# Patient Record
Sex: Female | Born: 1985 | Race: White | Hispanic: No | Marital: Married | Smoking: Never smoker
Health system: Southern US, Community
[De-identification: ages and names within clinical notes are randomized; demographics above are authoritative.]

## PROBLEM LIST (undated history)

## (undated) DIAGNOSIS — K219 Gastro-esophageal reflux disease without esophagitis: Secondary | ICD-10-CM

## (undated) HISTORY — DX: Gastro-esophageal reflux disease without esophagitis: K21.9

## (undated) HISTORY — PX: NO PAST SURGERIES: SHX2092

---
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
## 2007-03-18 ENCOUNTER — Emergency Department (HOSPITAL_COMMUNITY): Admission: EM | Admit: 2007-03-18 | Discharge: 2007-03-18 | Payer: Self-pay | Admitting: Emergency Medicine

## 2007-09-02 ENCOUNTER — Emergency Department (HOSPITAL_COMMUNITY): Admission: EM | Admit: 2007-09-02 | Discharge: 2007-09-02 | Payer: Self-pay | Admitting: Family Medicine

## 2009-01-08 IMAGING — CR DG CHEST 2V
2 series · 2 of 2 positions shown · non-contrast
Comparison: There are no prior studies for comparison.

CLINICAL DATA: Cough. 
 CHEST - 2 VIEW:

[view not recorded (1 of 2)]
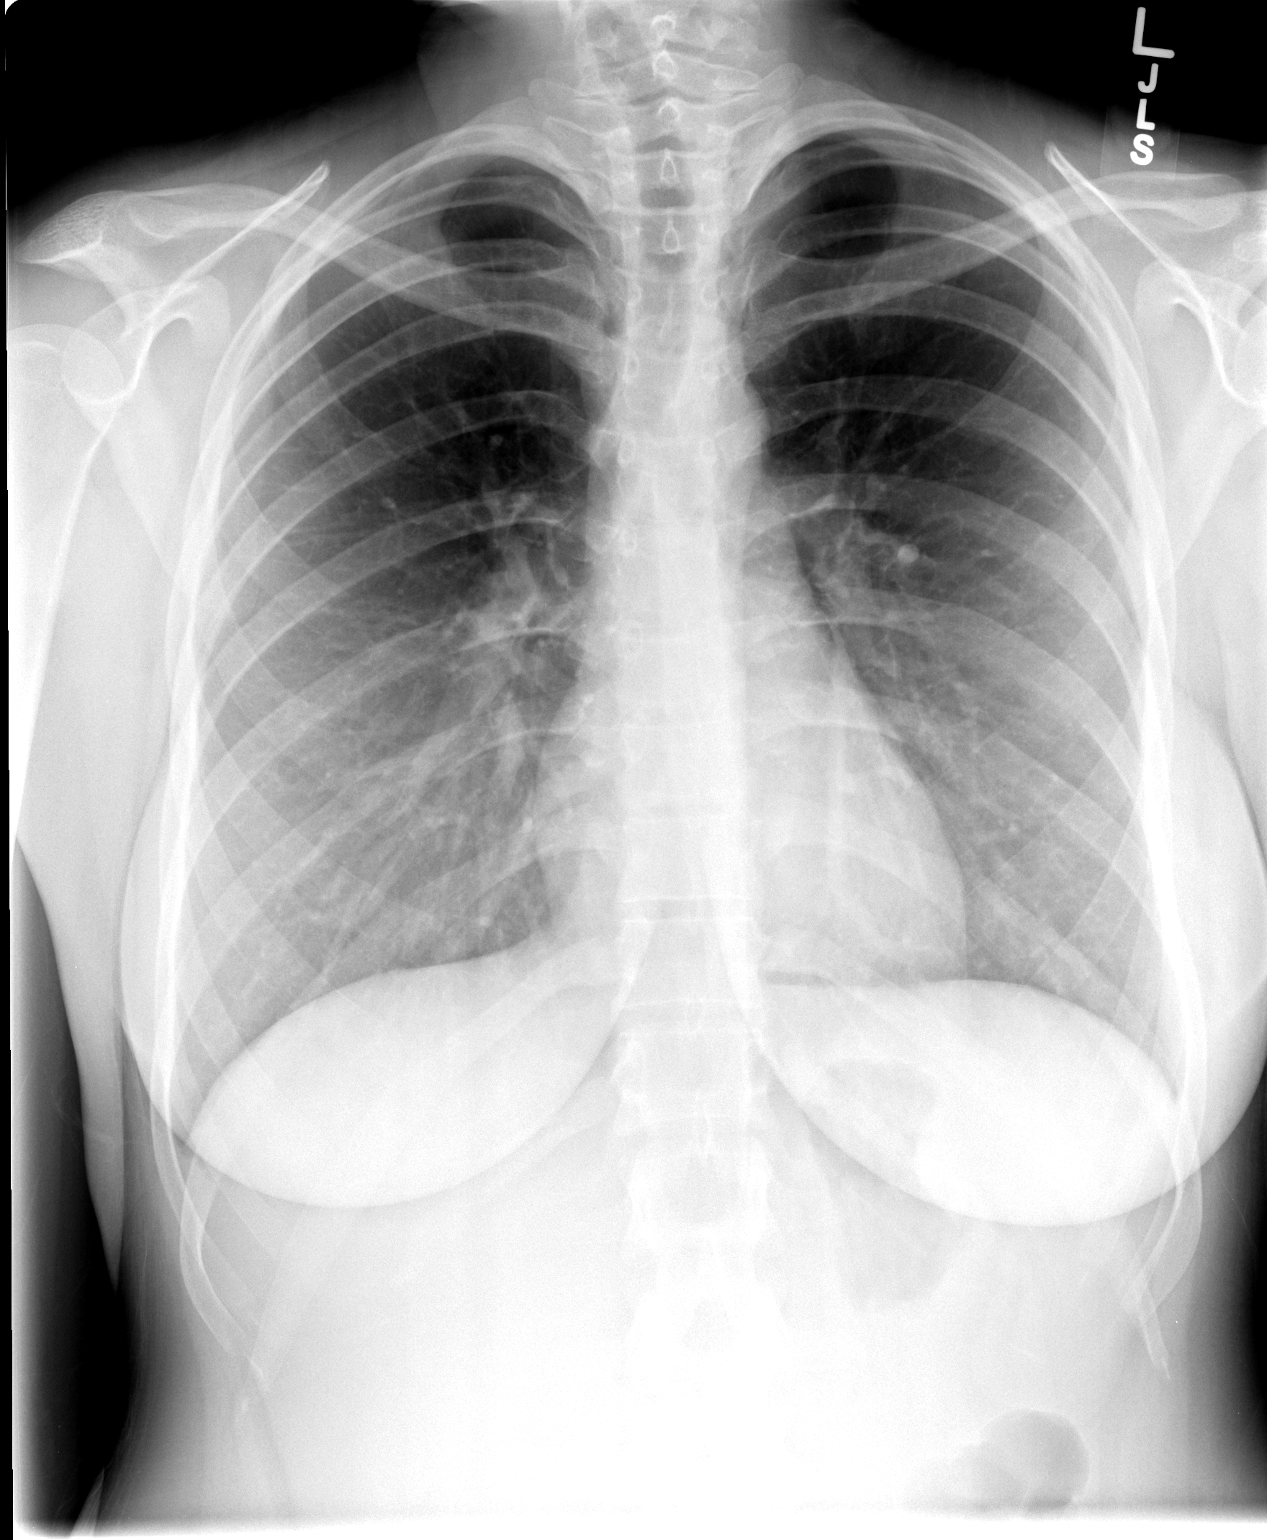

[view not recorded (2 of 2)]
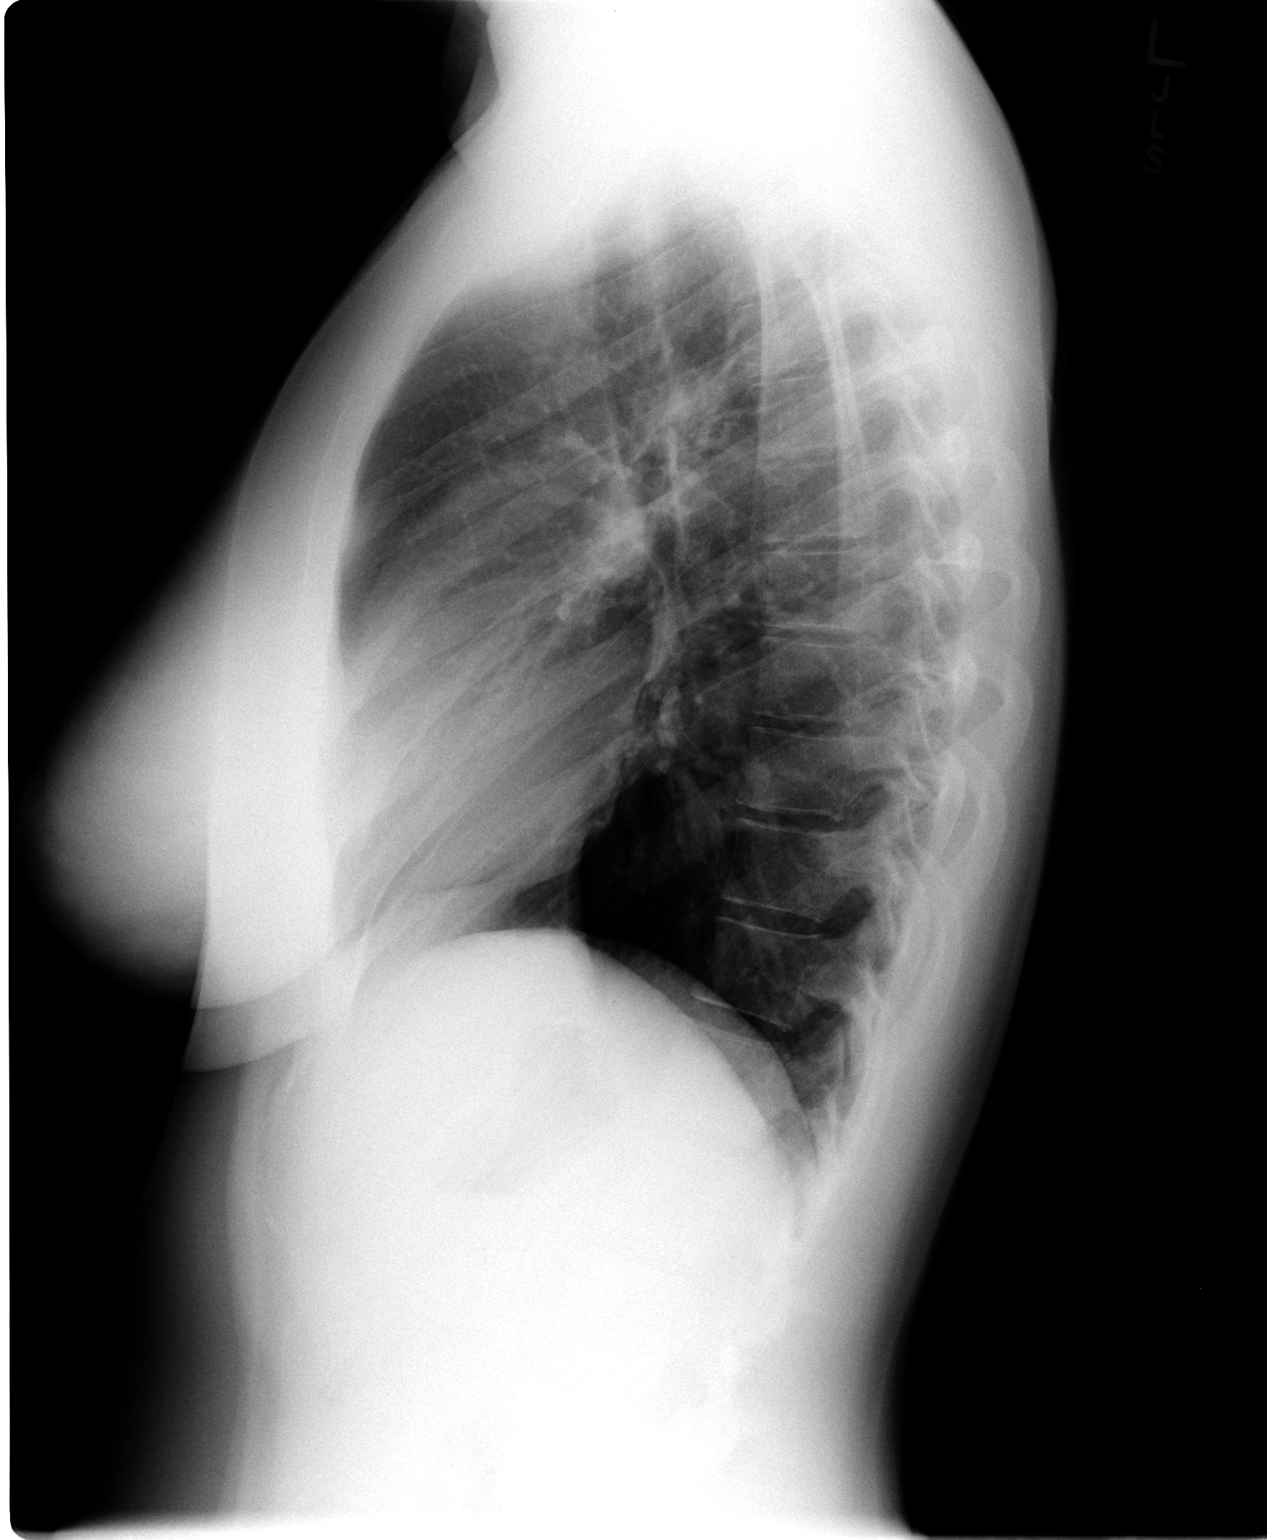

[2 of 2 positions shown; findings below may reference images not displayed]

FINDINGS: The cardiomediastinal silhouette is unremarkable.  There is no acute infiltrate or pleural effusion.  Mild central increased bronchial markings are noted.
IMPRESSION: No acute infiltrate or pleural effusion.  Mild central increased bronchial markings noted.

## 2009-01-30 ENCOUNTER — Emergency Department (HOSPITAL_COMMUNITY): Admission: EM | Admit: 2009-01-30 | Discharge: 2009-01-30 | Payer: Self-pay | Admitting: Emergency Medicine

## 2009-05-16 ENCOUNTER — Ambulatory Visit: Payer: Self-pay | Admitting: Occupational Medicine

## 2009-05-19 ENCOUNTER — Ambulatory Visit: Payer: Self-pay | Admitting: Family Medicine

## 2009-05-19 DIAGNOSIS — R61 Generalized hyperhidrosis: Secondary | ICD-10-CM | POA: Insufficient documentation

## 2009-05-19 DIAGNOSIS — K219 Gastro-esophageal reflux disease without esophagitis: Secondary | ICD-10-CM

## 2009-10-06 ENCOUNTER — Ambulatory Visit: Payer: Self-pay | Admitting: Family Medicine

## 2009-10-06 DIAGNOSIS — B081 Molluscum contagiosum: Secondary | ICD-10-CM

## 2009-10-07 ENCOUNTER — Ambulatory Visit: Payer: Self-pay | Admitting: Family Medicine

## 2010-01-18 ENCOUNTER — Ambulatory Visit: Payer: Self-pay | Admitting: Family Medicine

## 2010-02-16 ENCOUNTER — Ambulatory Visit
Admission: RE | Admit: 2010-02-16 | Discharge: 2010-02-16 | Payer: Self-pay | Source: Home / Self Care | Attending: Family Medicine | Admitting: Family Medicine

## 2010-02-16 ENCOUNTER — Other Ambulatory Visit
Admission: RE | Admit: 2010-02-16 | Discharge: 2010-02-16 | Payer: Self-pay | Source: Home / Self Care | Admitting: Family Medicine

## 2010-02-16 LAB — CONVERTED CEMR LAB
Ketones, urine, test strip: NEGATIVE
Protein, U semiquant: NEGATIVE
Specific Gravity, Urine: 1.02
Urobilinogen, UA: 2
pH: 7.5

## 2010-02-17 ENCOUNTER — Encounter: Payer: Self-pay | Admitting: Family Medicine

## 2010-02-20 LAB — CONVERTED CEMR LAB: Pap Smear: NORMAL

## 2010-02-21 ENCOUNTER — Ambulatory Visit
Admission: RE | Admit: 2010-02-21 | Discharge: 2010-02-21 | Payer: Self-pay | Source: Home / Self Care | Attending: Family Medicine | Admitting: Family Medicine

## 2010-02-23 ENCOUNTER — Encounter: Payer: Self-pay | Admitting: Family Medicine

## 2010-02-24 ENCOUNTER — Encounter: Payer: Self-pay | Admitting: Family Medicine

## 2010-02-24 LAB — CONVERTED CEMR LAB
Iron: 88 ug/dL (ref 42–145)
Saturation Ratios: 28 % (ref 20–55)
UIBC: 227 ug/dL
WBC: 7.1 10*3/uL (ref 4.0–10.5)

## 2010-03-14 NOTE — Letter (Signed)
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   [place], [place] [address]   Phone: [phone]  Fax: [phone]    Today's Date: May 16, 2009  Name of Patient: Holly Rivas  The above named patient had a medical visit today at:  am / pm.  Please take this into consideration when reviewing the time away from work/school.    Special Instructions:  [  ] None  Arly.Keller  ] To be off the remainder of today, returning to the normal work / school schedule tomorrow.  [  ] To be off until the next scheduled appointment on ______________________.  [  ] Other ________________________________________________________________ ________________________________________________________________________   Sincerely yours,   Lucia Gaskins MD

## 2010-03-14 NOTE — Assessment & Plan Note (Signed)
Summary: RASH ON BACK OF LEFT LEG (4)   Vital Signs:  Patient Profile:   25 Years Old Female CC:      rash/bumps to posterior left thigh Height:     66.5 inches Weight:      140 pounds O2 Sat:      99 % O2 treatment:    Room Air Temp:     97.8 degrees F oral Pulse rate:   73 / minute Resp:     14 per minute BP sitting:   114 / 77  (left arm) Cuff size:   regular  Pt. in pain?   no  Vitals Entered By: Lajean Saver RN (October 06, 2009 8:40 AM)                   Updated Prior Medication List: No Medications Current Allergies: ! * ASPERTAMEHistory of Present Illness Chief Complaint: rash/bumps to posterior left thigh History of Present Illness:  Subjective:  Patient complains of noticing a rash on her left buttock and left posterior thigh last night.  There is no pain, swelling, itching.  No other symptoms.  REVIEW OF SYSTEMS Constitutional Symptoms      Denies fever, chills, night sweats, weight loss, weight gain, and fatigue.  Eyes       Denies change in vision, eye pain, eye discharge, glasses, contact lenses, and eye surgery. Ear/Nose/Throat/Mouth       Denies hearing loss/aids, change in hearing, ear pain, ear discharge, dizziness, frequent runny nose, frequent nose bleeds, sinus problems, sore throat, hoarseness, and tooth pain or bleeding.  Respiratory       Denies dry cough, productive cough, wheezing, shortness of breath, asthma, bronchitis, and emphysema/COPD.  Cardiovascular       Denies murmurs, chest pain, and tires easily with exhertion.    Gastrointestinal       Denies stomach pain, nausea/vomiting, diarrhea, constipation, blood in bowel movements, and indigestion. Genitourniary       Denies painful urination, kidney stones, and loss of urinary control. Neurological       Denies paralysis, seizures, and fainting/blackouts. Musculoskeletal       Denies muscle pain, joint pain, joint stiffness, decreased range of motion, redness, swelling, muscle  weakness, and gout.  Skin       Denies bruising, unusual mles/lumps or sores, and hair/skin or nail changes.      Comments: posterior left thigh Psych       Denies mood changes, temper/anger issues, anxiety/stress, speech problems, depression, and sleep problems. Other Comments: Patient noticed small bumps to posterior left thigh 2 days ago. Denies pain or itching. Patient has animal that lives in the house.   Past History:  Past Medical History: Reviewed history from 05/16/2009 and no changes required. Unremarkable  Past Surgical History: Reviewed history from 05/16/2009 and no changes required. Denies surgical history  Family History: Reviewed history from 05/19/2009 and no changes required. 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  Lesions measure 2 to 3mm dia. 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 Patient Level III [16109] Planning Comments:  Given a Water quality scientist patient information and instruction sheet on topic.   Reassurance.  Suggested that treatment not necessary, but if she wishes may follow-up with PCP or dermatologist for cryotherapy.   The patient and/or caregiver has been counseled thoroughly with regard to medications prescribed including dosage, schedule, interactions, rationale for use, and possible side effects and they verbalize understanding.  Diagnoses and expected course of recovery discussed and will return if not improved as expected or if the condition worsens. Patient and/or caregiver verbalized understanding.   Orders Added: 1)  Est.  Patient Level III [16109]

## 2010-03-14 NOTE — Assessment & Plan Note (Signed)
Summary: NOV: hyperhydrosis, GERD   Vital Signs:  Patient profile:   25 year old female Height:      67 inches Weight:      145 pounds BMI:     22.79 Temp:     98.4 degrees F oral Pulse rate:   90 / minute BP sitting:   116 / 71  (left arm) Cuff size:   regular  Vitals Entered By: Kathlene November (May 19, 2009 3:42 PM) CC: Np- sweats alot and has a friend who is on meds for it and says it helps and she would like to try med Is Patient Diabetic? No   CC:  Np- sweats alot and has a friend who is on meds for it and says it helps and she would like to try med.  History of Present Illness: Np- sweats alot and has a friend who is on meds for it and says it helps and she would like to try med.  Sweating is maily under her arms. Has had it for year.    Has occ chest pain on ther right side.  Randomly happens. Occurs 2-3 times a week. No known triggers.  Radiates into her right breast.  Uusally only last 1-2 minutes then resolves, sharp shooting pain.  No tenderness or trauma.  Heartburn sxs almost every days. + brash frequently.  Drink about 2-3 sodas a day.  Has been worse since increased her heartburn sxs.  Eats of lot of greasy and friend foo  Habits & Providers  Alcohol-Tobacco-Diet     Alcohol drinks/day: 0     Tobacco Status: current     Cigarette Packs/Day: 0.5     Year Started: 5  Exercise-Depression-Behavior     Does Patient Exercise: no     STD Risk: never     Drug Use: never     Seat Belt Use: always  Current Medications (verified): 1)  Metronidazole 500 Mg Tabs (Metronidazole) .... Take One Tablet By Mouth Once A Day For 1 Week  Allergies (verified): No Known Drug Allergies  Comments:  Nurse/Medical Assistant: The patient's medications and allergies were reviewed with the patient and were updated in the Medication and Allergy Lists. 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  No vison changes.  No difficulty hearing/ringing in ears, hay fever/allergies.  No chest pain/discomfort, palpitations.  No Br lump/nipple discharge.  No cough/wheeze.  No blood in BM, nausea/vomiting/diarrhea.  No nighttime urination, leaking urine, unusual vaginal bleeding, discharge (penis or vagina).  No muscle/joint pain. No rash, change in mole.  No HA, memory loss.  + anxiety, sleep d/o, depression.  No easy bruising/bleeding, unexplained lump   Physical Exam  General:  Well-developed,well-nourished,in no acute distress; alert,appropriate and cooperative throughout examination Head:  Normocephalic and atraumatic without obvious abnormalities. No apparent alopecia or balding. Mouth:  Oral mucosa and oropharynx without lesions or exudates.  Teeth in good repair. Neck:  No deformities, masses, or tenderness noted. Chest Wall:  No deformities, masses, or tenderness noted. Lungs:  Normal respiratory effort, chest expands symmetrically. Lungs are clear to auscultation, no crackles or wheezes. Heart:  Normal rate and regular rhythm. S1 and S2 normal without gallop, murmur, click, rub or other extra sounds. Abdomen:  Bowel sounds positive,abdomen soft and non-tender without masses, organomegaly or hernias noted. Pulses:  Radial 2+  Psych:  Cognition and judgment appear intact. Alert and cooperative with normal attention span and concentration. No apparent delusions, illusions, hallucinations   Impression & Recommendations:  Problem # 1:  HYPERHIDROSIS (ICD-780.8) Dsicussed dx adn treatment.  No other history or signs of thyroid or metabolic  d/o. Discussed treatment with aluminum chloride solution and moving her deoderant application to bedtime.  F/U if not helping. Can taper down to once a week for maintenance once the desired effect is acheved. If not working well then can refer for botox injection.   Problem # 2:  GERD (ICD-530.81) I think this may be causing her right sided chest pain.  Discussed dietary changes and gave samples of protonix for 10 days to see if helping. If working well then will tx for 8 weeks. Can either send rx if workign well or can change to prilosec or prevacid both of which are OTC.   Call if not better.   Complete Medication List: 1)  Metronidazole 500 Mg Tabs (Metronidazole) .... Take one tablet by mouth once a day for 1 week 2)  Hypercare 20 % Soln (Aluminum chloride) .... Apply at bedtime to axilla nightly until desired effect then taper down to once a week as tolerated.  Patient Instructions: 1)  Call me if the protonix is not helping.  Call if still having chest pain.   2)  Taper the hypercare as your symptoms improve down to once a week for maintenance.  Prescriptions: HYPERCARE 20 % SOLN (ALUMINUM CHLORIDE) Apply at bedtime to axilla nightly until desired effect then taper down to once a week as tolerated.  #1 bottle. x 6   Entered and Authorized by:   Nani Gasser MD   Signed by:   Nani Gasser MD on 05/19/2009   Method used:   Electronically to        [facility] [phone]* (retail)       [address]       [place], [place]  [address]       Ph: [phone] or [phone]       Fax: [phone]   RxID:   [id number]

## 2010-03-14 NOTE — Assessment & Plan Note (Signed)
Summary: molluscum,hyperhydrosis   Vital Signs:  Patient profile:   25 year old female Height:      66.5 inches Weight:      140 pounds Pulse rate:   70 / minute BP sitting:   118 / 70  (left arm) Cuff size:   regular  Vitals Entered By: Avon Gully CMA, Duncan Dull) (October 07, 2009 8:40 AM) CC: Mollescum on legs   CC:  Mollescum on legs.  History of Present Illness: Noticed a bump on her leg and then noticed a few in groin. Went to UC who dx her with molluscum and recommended f/u with PCP.    Hyperhydrosis- Did well initially with the aluminum choride but then started to burn and irrritate her skin even though was only using once a week.    Current Medications (verified): 1)  None  Allergies (verified): 1)  ! * Aspertame  Comments:  Nurse/Medical Assistant: The patient's medications and allergies were reviewed with the patient and were updated in the Medication and Allergy Lists. Avon Gully CMA, Duncan Dull) (October 07, 2009 8:41 AM)  Physical Exam  General:  Well-developed,well-nourished,in no acute distress; alert,appropriate and cooperative throughout examination Lungs:  Normal respiratory effort, chest expands symmetrically. Lungs are clear to auscultation, no crackles or wheezes. Heart:  Normal rate and regular rhythm. S1 and S2 normal without gallop, murmur, click, rub or other extra sounds. Skin:  9 molluscum on her groin and back thigh area.     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  Treated with cryotherapy. Pt tolerated well.   Orders: Cryotherapy/Destruction benign or premalignant lesion (1st lesion)  (17000) Cryotherapy/Destruction benign or premalignant lesion (2nd-14th lesions) (17003)  Problem # 2:  HYPERHIDROSIS (ICD-780.8) Give skin a break from the aluminum chloride for about  6 weeks and then retry. Can also consider botox injections.   Procedure Note  Wart Removal: Onset of lesion:  a few weeks.  Indication: molluscum  Procedure # 1: cryotherapy    Region: groin area    Technique: cryotherapy    Anesthesia: none

## 2010-03-14 NOTE — Assessment & Plan Note (Signed)
Summary: PRESSURE IN BOTH EARS   Vital Signs:  Patient Profile:   25 Years Old Female CC:      Pressure in ears x 2 weeks O2 Sat:      98 % O2 treatment:    Room Air Temp:     97.9 degrees F oral Pulse rate:   61 / minute Pulse rhythm:   regular Resp:     16 per minute BP sitting:   112 / 67  (right arm) Cuff size:   regular  Vitals Entered By: Emilio Math (May 16, 2009 10:49 AM)                  Current Allergies: No known allergies History of Present Illness Chief Complaint: Pressure in ears x 2 weeks History of Present Illness: Presents with bilateral ear cerumen impaction.    Both ears cleaned using elephant ear wash.   No reports of fever, sore throat or ear pain.   REVIEW OF SYSTEMS Constitutional Symptoms      Denies fever, chills, night sweats, weight loss, weight gain, and fatigue.  Eyes       Complains of eye pain.      Denies change in vision, eye discharge, glasses, contact lenses, and eye surgery. Ear/Nose/Throat/Mouth       Denies hearing loss/aids, change in hearing, ear pain, ear discharge, dizziness, frequent runny nose, frequent nose bleeds, sinus problems, sore throat, hoarseness, and tooth pain or bleeding.  Respiratory       Denies dry cough, productive cough, wheezing, shortness of breath, asthma, bronchitis, and emphysema/COPD.  Cardiovascular       Denies murmurs, chest pain, and tires easily with exhertion.    Gastrointestinal       Denies stomach pain, nausea/vomiting, diarrhea, constipation, blood in bowel movements, and indigestion. Genitourniary       Denies painful urination, kidney stones, and loss of urinary control. Neurological       Denies paralysis, seizures, and fainting/blackouts. Musculoskeletal       Denies muscle pain, joint pain, joint stiffness, decreased range of motion, redness, swelling, muscle weakness, and gout.  Skin       Denies bruising, unusual mles/lumps or sores, and hair/skin or nail changes.  Psych  Denies mood changes, temper/anger issues, anxiety/stress, speech problems, depression, and sleep problems.  Past History:  Past Medical History: Unremarkable  Past Surgical History: Denies surgical history  Family History: Mother, Healthy Father,D  Social History: 1/2 ppd, 5 yrs ETOH-yes No Drugs [employer]  Physical Exam General appearance: well developed, well nourished, no acute distress Ears: bilateral cerumen impaction.   Clear TM's seen after cerumen removed.  Neck: neck supple,  trachea midline, no masses Thyroid: no nodules, masses, tenderness, or enlargement Chest/Lungs: no rales, wheezes, or rhonchi bilateral, breath sounds equal without effort Assessment New Problems: CERUMEN IMPACTION (ICD-380.4)   Plan New Orders: New Patient Level II [99202] Planning Comments:   Cerumen removed Follow up as needed.   The patient and/or caregiver has been counseled thoroughly with regard to medications prescribed including dosage, schedule, interactions, rationale for use, and possible side effects and they verbalize understanding.  Diagnoses and expected course of recovery discussed and will return if not improved as expected or if the condition worsens. Patient and/or caregiver verbalized understanding.

## 2010-03-16 NOTE — Letter (Signed)
Summary: Physical Exam Form  Physical Exam Form   Imported By: Lanelle Bal 03/09/2010 09:14:02  _____________________________________________________________________  External Attachment:    Type:   Image     Comment:   External Document

## 2010-03-16 NOTE — Assessment & Plan Note (Signed)
Summary: CPE with pap   Vital Signs:  Patient profile:   25 year old Rivas Height:      66.5 inches Weight:      142 pounds Pulse rate:   83 / minute BP sitting:   123 / 71  (right arm) Cuff size:   regular  Vitals Entered By: Avon Gully CMA, Duncan Dull) (February 16, 2010 2:53 PM) CC: CPE,pap, has forms for school  Vision Screening:Left eye w/o correction: 20 / 20 Right Eye w/o correction: 20 / 20 Both eyes w/o correction:  20/ 20        Vision Entered By: Avon Gully CMA, Duncan Dull) (February 16, 2010 3:43 PM)  Hearing Screen  20db HL: Left  500 hz: No Response 1000 hz: No Response 2000 hz: No Response 4000 hz: No Response Right  500 hz: No Response 1000 hz: No Response 2000 hz: No Response 4000 hz: No Response   Hearing Testing Entered By: Avon Gully CMA, (AAMA) (February 16, 2010 3:44 PM) 25db HL: Left  500 hz: 25db 1000 hz: 25db 2000 hz: 25db 4000 hz: 25db Right  500 hz: 25db 1000 hz: 25db 2000 hz: 25db 4000 hz: 25db    CC:  CPE, pap, and has forms for school.  History of Present Illness: Here for CPE with pap. Also needs forms for phlebotomy class completed.  She does need copy of Uptodate vaccines, urine, and hemoblobin testing. Also needs uptodate TB skin test so will have to return on Monday for this.   Has noticed some pelvic cramping about mid cycle.  Says willl alst a cuple of days. No spottin between periods. She is not on birth control. No abnormaol bleeding. No currently sexually active but has been in the past.   Current Medications (verified): 1)  None  Allergies (verified): 1)  ! * Aspertame  Comments:  Nurse/Medical Assistant: The patient's medications and allergies were reviewed with the patient and were updated in the Medication and Allergy Lists. 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    Physical Exam  General:  Well-developed,well-nourished,in no acute distress; alert,appropriate and cooperative throughout examination Head:  Normocephalic and atraumatic without obvious abnormalities. No apparent alopecia or balding. Eyes:  No corneal or conjunctival inflammation noted. EOMI. Perrla. Ears:  External ear exam shows no significant lesions or deformities.  Otoscopic examination reveals clear canals, tympanic membranes are intact bilaterally without bulging, retraction, inflammation or discharge. Hearing is grossly normal bilaterally. Nose:  External nasal examination shows no deformity or inflammation. Nasal mucosa are pink and moist without lesions or exudates. Mouth:  Oral mucosa and oropharynx without lesions or exudates.  Teeth in good repair. Neck:  No deformities, masses, or tenderness  noted. Chest Wall:  No deformities, masses, or tenderness noted. Breasts:  No mass, nodules, thickening, tenderness, bulging, retraction, inflamation, nipple discharge or skin changes noted.   Lungs:  Normal respiratory effort, chest expands symmetrically. Lungs are  clear to auscultation, no crackles or wheezes. Heart:  Normal rate and regular rhythm. S1 and S2 normal without gallop, murmur, click, rub or other extra sounds. Abdomen:  Bowel sounds positive,abdomen soft and non-tender without masses, organomegaly or hernias noted. Genitalia:  Normal introitus for age, no external lesions, no vaginal discharge, mucosa pink and moist, no vaginal or cervical lesions, no vaginal atrophy, no friaility or hemorrhage, normal uterus size and position, no adnexal masses. Mild tenderness in the RLQ.  Msk:  No deformity or scoliosis noted of thoracic or lumbar spine.   Pulses:  R and L carotid,radial,dorsalis pedis and posterior tibial pulses are full and equal bilaterally Extremities:  No clubbing, cyanosis, edema, or deformity noted with normal full range of motion of all joints.   Neurologic:  No cranial nerve deficits noted. Station and gait are normal.Sensory, motor and coordinative functions appear intact. Skin:  no rashes.   Cervical Nodes:  No lymphadenopathy noted Axillary Nodes:  No palpable lymphadenopathy Psych:  Cognition and judgment appear intact. Alert and cooperative with normal attention span and concentration. No apparent delusions, illusions, hallucinations   Impression & Recommendations:  Problem # 1:  ROUTINE GYNECOLOGICAL EXAMINATION (ICD-V72.31) Consider she may have some ovarian cysts that may be causing her discomfort. UA is neg for infection. Consider pelvic US for further evaluation.  Also hemobloging is borderline anemia so I do recommend checking CBC and iron levels. Wil complte forms. We have some of her vaccines and will need to check some titers as well.  Orders: Fingerstick (36416) Hgb (85018) UA Dipstick w/o Micro (automated)  (81003) T-Varicella-Zoster Antibody [id number]) T-Measles (Rubeola) Antibody IgG (21308-65784) T-Mumps Virus Antibody, IgG (69629-52841) T-Rubella Antibody (32440-10272)  Other Orders: T-CBC No Diff  (53664-40347) T-Iron (42595-63875) T-Iron Binding Capacity (TIBC) ([address])  Patient Instructions: 1)  Return on Monday for TB skin test.    Orders Added: 1)  Fingerstick [36416] 2)  Hgb [85018] 3)  UA Dipstick w/o Micro (automated)  [81003] 4)  T-Varicella-Zoster Antibody [84166-06301] 5)  T-Measles (Rubeola) Antibody IgG [60109-32355] 6)  T-Mumps Virus Antibody, IgG [73220-25427] 7)  T-Rubella Antibody [06237-62831] 8)  Est. Patient age 2-39 [99395] 52)  T-CBC No Diff [85027-10000] 10)  T-Iron [51761-60737] 11)  T-Iron Binding Capacity (TIBC) [[address]]    Laboratory Results   Urine Tests  Date/Time Received: 02/16/09 Date/Time Reported: 02/16/09  Routine Urinalysis   Color: yellow Appearance: Clear Glucose: negative   (Normal Range: Negative) Bilirubin: negative   (Normal Range: Negative) Ketone: negative   (Normal Range: Negative) Spec. Gravity: 1.020   (Normal Range: 1.003-1.035) Blood: negative   (Normal Range: Negative) pH: 7.5   (Normal Range: 5.0-8.0) Protein: negative   (Normal Range: Negative) Urobilinogen: 2.0   (Normal Range: 0-1) Nitrite: negative   (Normal Range: Negative) Leukocyte Esterace: negative   (Normal Range: Negative)     Blood Tests   Date/Time Received: 02/16/09 Date/Time Reported: 02/16/09    CBC   HGB:  12.7 g/dL   (Normal Range: Holly.2-70.3 in Males, 12.0-15.0 in Females)

## 2010-03-16 NOTE — Assessment & Plan Note (Signed)
Summary: TB TEST/FLU-SHOT PT  Nurse Visit    Allergies: 1)  ! * Aspertame  Immunizations Administered:  Influenza Vaccine # 1:    Vaccine Type: Fluvax 3+    Site: left deltoid    Mfr: GlaxoSmithKline    Dose: 0.5 ml    Route: IM    Given by: Kathlene November LPN    Exp. Date: 08/12/2010    Lot #: [id number]    VIS given: 09/06/09 version given February 21, 2010.  PPD Skin Test:    Vaccine Type: PPD    Site: left forearm    Mfr: Sanofi Pasteur    Dose: 0.1 ml    Route: ID    Given by: Kathlene November LPN    Exp. Date: 08/19/2011    Lot #: [id number]  Flu Vaccine Consent Questions:    Do you have a history of severe allergic reactions to this vaccine? no    Any prior history of allergic reactions to egg and/or gelatin? no    Do you have a sensitivity to the preservative Thimersol? no    Do you have a past history of Guillan-Barre Syndrome? no    Do you currently have an acute febrile illness? no    Have you ever had a severe reaction to latex? no    Vaccine information given and explained to patient? yes    Are you currently pregnant? no  Orders Added: 1)  Flu Vaccine 52yrs + [90658] 2)  Admin 1st Vaccine [90471] 3)  TB Skin Test [01093] 4)  Admin of Any Addtl Vaccine [90472]  Appended Document: TB TEST   PPD Results    Date of reading: 02/23/2010    Results: < 5mm    Interpretation: negative

## 2010-05-16 ENCOUNTER — Ambulatory Visit (INDEPENDENT_AMBULATORY_CARE_PROVIDER_SITE_OTHER): Payer: 59 | Admitting: Family Medicine

## 2010-05-16 ENCOUNTER — Encounter: Payer: Self-pay | Admitting: Family Medicine

## 2010-05-16 DIAGNOSIS — M545 Low back pain, unspecified: Secondary | ICD-10-CM

## 2010-05-16 DIAGNOSIS — H9209 Otalgia, unspecified ear: Secondary | ICD-10-CM

## 2010-05-16 MED ORDER — NAPROXEN 500 MG PO TABS: 500.0000 mg | ORAL_TABLET | Freq: Two times a day (BID) | ORAL | Status: AC

## 2010-05-16 MED ORDER — AMBULATORY NON FORMULARY MEDICATION: Status: DC

## 2010-05-16 NOTE — Progress Notes (Signed)
  Subjective:    Patient ID: Holly Rivas, female    DOB: [birth year], 25 y.o.   MRN: [id number]  HPI  25 yo WF presents for itching and fullness in both ears on and off for hearing.  No tinnitis or muffled hearing.  No drainage.  She has had wax in the past.  Denies ear trauma.  Has TMJ.  She also has some low back pain which started about a month ago.  She started clinical rotations for school recently.  She is standing a lot during the day.  She gets aching into her thighs.  She has had to take Aleve at night.  Sitting makes it worse. No hx of back problems.  Denies tingling or nubmness into the legs or weakness.  She is not exercising at all.  BP 121/78  Pulse 80  Ht 5\' 7"  (1.702 m)  Wt 142 lb (64.411 kg)  BMI 22.24 kg/m2  SpO2 100%  LMP 05/15/2010  Review of Systems as per HPI    Objective:   Physical Exam  Constitutional: She appears well-developed and well-nourished. No distress.  HENT:  Head: Normocephalic and atraumatic.  Right Ear: External ear normal.  Left Ear: External ear normal.       Popping and clicking over both TMJs  Musculoskeletal: She exhibits no edema.       Tender at L5- SI midline , worse with flexion, no SI tenderness.  Only hamstring tightness on straight leg raise.  Gait normal.  No pain with lumbar extension  Neurological: She has normal reflexes.  Skin: Skin is warm and dry.          Assessment & Plan:

## 2010-05-16 NOTE — Assessment & Plan Note (Signed)
Normal ear exam other than the abscence of cerumen.  We discussed overcleaning as this can cause itching.  Will give her Hydrocortisone/ Acetic Acid drops to use for the next 3 days and then as needed for symptoms of EAC swelling and itching.

## 2010-05-16 NOTE — Assessment & Plan Note (Signed)
Exam / Hx  Consistent with MSK LBP likely from long hours of sitting/ standing between work and school and lack of regular exercise.  Advised daily yoga stretches, exercise and core strengthening to help posture.  RX for Naprosyn given to use as needed and can use thermacare heat wraps.  No red flags.

## 2010-05-16 NOTE — Patient Instructions (Signed)
For back pain:  Use Naprosyn up to 2 x a day with breakfast and dinner for pain. Try to do home yoga stretches each day and exercise on a regular basis. OK to try thermacare heat wraps.  For ears, use RX drop for the next 3 days then use as needed for itching.

## 2010-11-03 LAB — POCT RAPID STREP A: Streptococcus, Group A Screen (Direct): NEGATIVE

## 2010-11-03 LAB — INFLUENZA A AND B ANTIGEN (CONVERTED LAB): Influenza B Ag: NEGATIVE

## 2010-12-26 ENCOUNTER — Encounter: Payer: Self-pay | Admitting: *Deleted

## 2010-12-26 ENCOUNTER — Emergency Department
Admission: EM | Admit: 2010-12-26 | Discharge: 2010-12-26 | Disposition: A | Discharge status: Home / Self Care | Payer: 59 | Source: Home / Self Care | Attending: Emergency Medicine | Admitting: Emergency Medicine

## 2010-12-26 DIAGNOSIS — J069 Acute upper respiratory infection, unspecified: Secondary | ICD-10-CM

## 2010-12-26 MED ORDER — AZITHROMYCIN 250 MG PO TABS: ORAL_TABLET | ORAL | Status: AC

## 2010-12-26 NOTE — ED Notes (Signed)
Pt c/o bilateral ear ache, sinuses swelling, and chest tightness x 2 days. She has taken Nyquil with no relief. No fever.

## 2010-12-26 NOTE — ED Provider Notes (Signed)
History     CSN: [id number] Arrival date & time: 12/26/2010  8:25 AM   First MD Initiated Contact with Patient 12/26/10 [phone]      Chief Complaint  Patient presents with  . Facial Pain  . Otalgia    (Consider location/radiation/quality/duration/timing/severity/associated sxs/prior treatment) HPI Holly Rivas is a 25 y.o. female who complains of onset of cold symptoms for 2 days.  They are using Nyquil which helps a little bit. No sore throat No cough No pleuritic pain No wheezing + nasal congestion + post-nasal drainage + sinus pain/pressure + chest congestion No itchy/red eyes + bilateral earache No hemoptysis No SOB No chills/sweats No fever No nausea No vomiting No abdominal pain No diarrhea No skin rashes + fatigue + myalgias No headache    History reviewed. No pertinent past medical history.  History reviewed. No pertinent past surgical history.  Family History  Problem Relation Age of Onset  . COPD Father   . Heart failure Father     History  Substance Use Topics  . Smoking status: Current Everyday Smoker -- 0.5 packs/day  . Smokeless tobacco: Not on file  . Alcohol Use: No    OB History    Grav Para Term Preterm Abortions TAB SAB Ect Mult Living                  Review of Systems  Allergies  Review of patient's allergies indicates no known allergies.  Home Medications   Current Outpatient Rx  Name Route Sig Dispense Refill  . AMBULATORY NON FORMULARY MEDICATION  Medication Name:  Hydrocortisone/ acetic acid otic 1%/ 2% solution  Use 3 drops in each ear 3 x a day as needed 1 Bottle 0  . AZITHROMYCIN 250 MG PO TABS  Use as directed 1 each 0  . NAPROXEN 500 MG PO TABS Oral Take 1 tablet (500 mg total) by mouth 2 (two) times daily with a meal. 60 tablet 2    BP 114/74  Pulse 64  Temp(Src) 98.2 F (36.8 C) (Oral)  Resp 16  Ht 5\' 7"  (1.702 m)  Wt 155 lb (70.308 kg)  BMI 24.28 kg/m2  SpO2 99%  LMP 12/05/2010  Physical Exam  Nursing  note and vitals reviewed. Constitutional: She is oriented to person, place, and time. She appears well-developed and well-nourished.  HENT:  Head: Normocephalic and atraumatic.  Right Ear: Tympanic membrane, external ear and ear canal normal.  Left Ear: Tympanic membrane, external ear and ear canal normal.  Nose: Mucosal edema and rhinorrhea present.  Mouth/Throat: Posterior oropharyngeal erythema present. No oropharyngeal exudate or posterior oropharyngeal edema.  Neck: Neck supple.  Cardiovascular: Regular rhythm and normal heart sounds.   Pulmonary/Chest: Effort normal and breath sounds normal. No respiratory distress.  Neurological: She is alert and oriented to person, place, and time.  Skin: Skin is warm and dry.  Psychiatric: She has a normal mood and affect. Her speech is normal.    ED Course  Procedures (including critical care time)  Labs Reviewed - No data to display No results found.   No diagnosis found.    MDM   Viral URI  1)  Take the prescribed antibiotic as instructed.  Hold for a few days since likely viral.  If worsening, fever, more specific symptoms, then can consider taking. 2)  Use nasal saline solution (over the counter) at least 3 times a day. 3)  Use over the counter decongestants like Zyrtec-D every 12 hours as needed to help with  congestion.  If you have hypertension, do not take medicines with sudafed.  4)  Can take tylenol every 6 hours or motrin every 8 hours for pain or fever. 5)  Follow up with your primary doctor if no improvement in 5-7 days, sooner if increasing pain, fever, or new symptoms.      Lily Kocher, MD 12/26/10 [phone]

## 2011-07-27 ENCOUNTER — Ambulatory Visit: Payer: 59 | Admitting: Family Medicine

## 2011-07-31 ENCOUNTER — Ambulatory Visit: Payer: 59 | Admitting: Family Medicine

## 2011-07-31 DIAGNOSIS — Z0289 Encounter for other administrative examinations: Secondary | ICD-10-CM

## 2012-03-21 ENCOUNTER — Encounter: Payer: Self-pay | Admitting: Family Medicine

## 2012-03-21 ENCOUNTER — Ambulatory Visit (INDEPENDENT_AMBULATORY_CARE_PROVIDER_SITE_OTHER): Payer: 59 | Admitting: Family Medicine

## 2012-03-21 VITALS — BP 112/68 | HR 68 | Ht 67.0 in | Wt 156.0 lb

## 2012-03-21 DIAGNOSIS — I739 Peripheral vascular disease, unspecified: Secondary | ICD-10-CM

## 2012-03-21 DIAGNOSIS — Z720 Tobacco use: Secondary | ICD-10-CM

## 2012-03-21 DIAGNOSIS — R61 Generalized hyperhidrosis: Secondary | ICD-10-CM

## 2012-03-21 DIAGNOSIS — L74519 Primary focal hyperhidrosis, unspecified: Secondary | ICD-10-CM

## 2012-03-21 DIAGNOSIS — F17200 Nicotine dependence, unspecified, uncomplicated: Secondary | ICD-10-CM

## 2012-03-21 MED ORDER — ALUMINUM CHLORIDE 20 % EX SOLN: Freq: Every day | CUTANEOUS | Status: DC

## 2012-03-21 NOTE — Patient Instructions (Addendum)
We will call you with your lab results. If you don't here from [name] in about a week then please give [name] a call at [phone]. Please stop smoking   Smoking Cessation, Tips for Success YOU CAN QUIT SMOKING If you are ready to quit smoking, congratulations! You have chosen to help yourself be healthier. Cigarettes bring nicotine, tar, carbon monoxide, and other irritants into your body. Your lungs, heart, and blood vessels will be able to work better without these poisons. There are many different ways to quit smoking. Nicotine gum, nicotine patches, a nicotine inhaler, or nicotine nasal spray can help with physical craving. Hypnosis, support groups, and medicines help break the habit of smoking. Here are some tips to help you quit for good.  Throw away all cigarettes.   Clean and remove all ashtrays from your home, work, and car.   On a card, write down your reasons for quitting. Carry the card with you and read it when you get the urge to smoke.   Cleanse your body of nicotine. Drink enough water and fluids to keep your urine clear or pale yellow. Do this after quitting to flush the nicotine from your body.   Learn to predict your moods. Do not let a bad situation be your excuse to have a cigarette. Some situations in your life might tempt you into wanting a cigarette.   Never have "just one" cigarette. It leads to wanting another and another. Remind yourself of your decision to quit.   Change habits associated with smoking. If you smoked while driving or when feeling stressed, try other activities to replace smoking. Stand up when drinking your coffee. Brush your teeth after eating. Sit in a different chair when you read the paper. Avoid alcohol while trying to quit, and try to drink fewer caffeinated beverages. Alcohol and caffeine may urge you to smoke.   Avoid foods and drinks that can trigger a desire to smoke, such as sugary or spicy foods and alcohol.   Ask people who smoke not to smoke  around you.   Have something planned to do right after eating or having a cup of coffee. Take a walk or exercise to perk you up. This will help to keep you from overeating.   Try a relaxation exercise to calm you down and decrease your stress. Remember, you may be tense and nervous for the first 2 weeks after you quit, but this will pass.   Find new activities to keep your hands busy. Play with a pen, coin, or rubber band. Doodle or draw things on paper.   Brush your teeth right after eating. This will help cut down on the craving for the taste of tobacco after meals. You can try mouthwash, too.   Use oral substitutes, such as lemon drops, carrots, a cinnamon stick, or chewing gum, in place of cigarettes. Keep them handy so they are available when you have the urge to smoke.   When you have the urge to smoke, try deep breathing.   Designate your home as a nonsmoking area.   If you are a heavy smoker, ask your caregiver about a prescription for nicotine chewing gum. It can ease your withdrawal from nicotine.   Reward yourself. Set aside the cigarette money you save and buy yourself something nice.   Look for support from others. Join a support group or smoking cessation program. Ask someone at home or at work to help you with your plan to quit smoking.  Always ask yourself, "Do I need this cigarette or is this just a reflex?" Tell yourself, "Today, I choose not to smoke," or "I do not want to smoke." You are reminding yourself of your decision to quit, even if you do smoke a cigarette.  HOW WILL I FEEL WHEN I QUIT SMOKING?  The benefits of not smoking start within days of quitting.   You may have symptoms of withdrawal because your body is used to nicotine (the addictive substance in cigarettes). You may crave cigarettes, be irritable, feel very hungry, cough often, get headaches, or have difficulty concentrating.   The withdrawal symptoms are only temporary. They are strongest when you  first quit but will go away within 10 to 14 days.   When withdrawal symptoms occur, stay in control. Think about your reasons for quitting. Remind yourself that these are signs that your body is healing and getting used to being without cigarettes.   Remember that withdrawal symptoms are easier to treat than the major diseases that smoking can cause.   Even after the withdrawal is over, expect periodic urges to smoke. However, these cravings are generally short-lived and will go away whether you smoke or not. Do not smoke!   If you relapse and smoke again, do not lose hope. Most smokers quit 3 times before they are successful.   If you relapse, do not give up! Plan ahead and think about what you will do the next time you get the urge to smoke.  LIFE AS A NONSMOKER: MAKE IT FOR A MONTH, MAKE IT FOR LIFE Day 1: Hang this page where you will see it every day. Day 2: Get rid of all ashtrays, matches, and lighters. Day 3: Drink water. Breathe deeply between sips. Day 4: Avoid places with smoke-filled air, such as bars, clubs, or the smoking section of restaurants. Day 5: Keep track of how much money you save by not smoking. Day 6: Avoid boredom. Keep a good book with you or go to the movies. Day 7: Reward yourself! One week without smoking! Day 8: Make a dental appointment to get your teeth cleaned. Day 9: Decide how you will turn down a cigarette before it is offered to you. Day 10: Review your reasons for quitting. Day 11: Distract yourself. Stay active to keep your mind off smoking and to relieve tension. Take a walk, exercise, read a book, do a crossword puzzle, or try a new hobby. Day 12: Exercise. Get off the bus before your stop or use stairs instead of escalators. Day 13: Call on friends for support and encouragement. Day 14: Reward yourself! Two weeks without smoking! Day 15: Practice deep breathing exercises. Day 16: Bet a friend that you can stay a nonsmoker. Day 17: Ask to sit in  nonsmoking sections of restaurants. Day 18: Hang up "No Smoking" signs. Day 19: Think of yourself as a nonsmoker. Day 20: Each morning, tell yourself you will not smoke. Day 21: Reward yourself! Three weeks without smoking! Day 22: Think of smoking in negative ways. Remember how it stains your teeth, gives you bad breath, and leaves you short of breath. Day 23: Eat a nutritious breakfast. Day 24:Do not relive your days as a smoker. Day 25: Hold a pencil in your hand when talking on the telephone. Day 26: Tell all your friends you do not smoke. Day 27: Think about how much better food tastes. Day 28: Remember, one cigarette is one too many. Day 29: Take up a hobby  that will keep your hands busy. Day 30: Congratulations! One month without smoking! Give yourself a big reward. Your caregiver can direct you to community resources or hospitals for support, which may include:  Group support.   Education.   Hypnosis.   Subliminal therapy.  Document Released: 10/28/2003 Document Revised: 04/23/2011 Document Reviewed: 11/15/2008 [facility] Patient Information 2013 [place], [place].

## 2012-03-21 NOTE — Progress Notes (Signed)
  Subjective:    Patient ID: Holly Rivas, female    DOB: [birth year], 27 y.o.   MRN: [id number]  HPI Says her fingers will turn purple randomly. Started about 6 weeks ago.  She stopped wearing her rings to make sure it wasn't that.  They don't feel cold to touch.  They don't tingle or go numb.  Will last 4 hours. Happening on both hands. Not sure of cold is a trigger.  Doesn't turn white after turning purple.  Last time happened was when at the mall and was actually feeling hot.  Smoking some and smoking vapors as well (e cig).  No fever.  No CP , SOB, or palpitations. Has been more tired recently.  No sxs in her feet. Thumbs are mostly spared.  She did bring a picture of her fingers when this happens on her I phoned. N/A definitely appear to be purple from about the MCPs to the distal fingers.   Review of Systems     Objective:   Physical Exam  Constitutional: She is oriented to person, place, and time. She appears well-developed and well-nourished.  HENT:  Head: Normocephalic and atraumatic.  Right Ear: External ear normal.  Left Ear: External ear normal.  Nose: Nose normal.  Mouth/Throat: Oropharynx is clear and moist.       TMs and canals are clear.   Eyes: Conjunctivae normal and EOM are normal. Pupils are equal, round, and reactive to light.  Neck: Neck supple. No thyromegaly present.  Cardiovascular: Normal rate, regular rhythm and normal heart sounds.   Pulmonary/Chest: Effort normal and breath sounds normal. She has no wheezes.  Lymphadenopathy:    She has no cervical adenopathy.  Neurological: She is alert and oriented to person, place, and time.  Skin: Skin is warm and dry.  Psychiatric: She has a normal mood and affect.          Assessment & Plan:  Peripheral vasoconstriction of the hands-Consider raynauds.  But not classic.  No necessarily triggered by cold.  Reocmmend quitting smoking.  Will do some labs including CMP, TSH, CBC, sedimentation rate, ANA. I gave her  reassurance and she's not having significant pain or discomfort. Certainly this is Raynaud's we do not necessarily have to treat it unless she's having a lot of discomfort.   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 Call the pharmacy and they do have hyperkeratosis of all. Wasn't a separate her pharmacy.

## 2012-03-22 LAB — CBC WITH DIFFERENTIAL/PLATELET
Eosinophils Absolute: 0.1 10*3/uL (ref 0.0–0.7)
Eosinophils Relative: 1 % (ref 0–5)
HCT: 38.2 % (ref 36.0–46.0)
Lymphocytes Relative: 15 % (ref 12–46)
Lymphs Abs: 1.3 10*3/uL (ref 0.7–4.0)
MCH: 31.8 pg (ref 26.0–34.0)
MCV: 95 fL (ref 78.0–100.0)
Monocytes Absolute: 0.5 10*3/uL (ref 0.1–1.0)
Platelets: 234 10*3/uL (ref 150–400)
RBC: 4.02 MIL/uL (ref 3.87–5.11)
RDW: 13 % (ref 11.5–15.5)
WBC: 8.4 10*3/uL (ref 4.0–10.5)

## 2012-03-22 LAB — COMPLETE METABOLIC PANEL WITH GFR
ALT: 16 U/L (ref 0–35)
AST: 15 U/L (ref 0–37)
CO2: 25 mEq/L (ref 19–32)
Calcium: 9.3 mg/dL (ref 8.4–10.5)
Chloride: 106 mEq/L (ref 96–112)
GFR, Est African American: >89 mL/min
Sodium: 139 mEq/L (ref 135–145)
Total Protein: 6.6 g/dL (ref 6.0–8.3)

## 2013-01-06 ENCOUNTER — Ambulatory Visit: Payer: 59 | Admitting: Family Medicine

## 2013-02-20 ENCOUNTER — Encounter: Payer: Self-pay | Admitting: Family Medicine

## 2013-02-20 ENCOUNTER — Other Ambulatory Visit (HOSPITAL_COMMUNITY)
Admission: RE | Admit: 2013-02-20 | Discharge: 2013-02-20 | Disposition: A | Discharge status: Home / Self Care | Payer: 59 | Source: Ambulatory Visit | Attending: Family Medicine | Admitting: Family Medicine

## 2013-02-20 ENCOUNTER — Ambulatory Visit (INDEPENDENT_AMBULATORY_CARE_PROVIDER_SITE_OTHER): Payer: 59 | Admitting: Family Medicine

## 2013-02-20 VITALS — BP 115/67 | HR 83 | Temp 97.7°F | Ht 67.0 in | Wt 174.0 lb

## 2013-02-20 DIAGNOSIS — Z01419 Encounter for gynecological examination (general) (routine) without abnormal findings: Secondary | ICD-10-CM | POA: Insufficient documentation

## 2013-02-20 DIAGNOSIS — Z113 Encounter for screening for infections with a predominantly sexual mode of transmission: Secondary | ICD-10-CM | POA: Insufficient documentation

## 2013-02-20 DIAGNOSIS — Z23 Encounter for immunization: Secondary | ICD-10-CM

## 2013-02-20 DIAGNOSIS — N926 Irregular menstruation, unspecified: Secondary | ICD-10-CM

## 2013-02-20 DIAGNOSIS — Z124 Encounter for screening for malignant neoplasm of cervix: Secondary | ICD-10-CM

## 2013-02-20 DIAGNOSIS — Z0000 Encounter for general adult medical examination without abnormal findings: Secondary | ICD-10-CM

## 2013-02-20 NOTE — Progress Notes (Addendum)
  Subjective:     Holly Rivas is a 28 y.o. female and is here for a comprehensive physical exam. The patient reports problems - She has quit smoking and has gained some weight.  Says her peridods have been a little idffrenent. will be heavy for one day and then the next.  2-3 days she will spot. She's concerned about possible thyroid disease her father had thyroid problems. She wants to have her hormones checked she just feels like something is off.  History   Social History  . Marital Status: Single    Spouse Name: N/A    Number of Children: N/A  . Years of Education: N/A   Occupational History  . Not on file.   Social History Main Topics  . Smoking status: Former Smoker -- 0.50 packs/day    Quit date: 10/30/2012  . Smokeless tobacco: Not on file  . Alcohol Use: No  . Drug Use: No  . Sexual Activity: Not on file   Other Topics Concern  . Not on file   Social History Narrative  . No narrative on file   Health Maintenance  Topic Date Due  . Tetanus/tdap  01/16/2007  . Influenza Vaccine  09/12/2012  . Pap Smear  02/20/2013    The following portions of the patient's history were reviewed and updated as appropriate: allergies, current medications, past family history, past medical history, past social history, past surgical history and problem list.  Review of Systems A comprehensive review of systems was negative.   Objective:    BP 115/67  Pulse 83  Temp(Src) 97.7 F (36.5 C)  Ht 5\' 7"  (1.702 m)  Wt 174 lb (78.926 kg)  BMI 27.25 kg/m2  LMP 02/10/2013 General appearance: alert, cooperative and appears stated age Head: Normocephalic, without obvious abnormality, atraumatic Eyes: conjunctivae/corneas clear. PERRL, EOM's intact. Fundi benign. Ears: normal TM's and external ear canals both ears Nose: Nares normal. Septum midline. Mucosa normal. No drainage or sinus tenderness. Throat: lips, mucosa, and tongue normal; teeth and gums normal Neck: no adenopathy, no  carotid bruit, no JVD, supple, symmetrical, trachea midline and thyroid not enlarged, symmetric, no tenderness/mass/nodules Back: symmetric, no curvature. ROM normal. No CVA tenderness. Lungs: clear to auscultation bilaterally Breasts: normal appearance, no masses or tenderness Heart: regular rate and rhythm, S1, S2 normal, no murmur, click, rub or gallop Abdomen: soft, non-tender; bowel sounds normal; no masses,  no organomegaly Pelvic: cervix normal in appearance, external genitalia normal, no adnexal masses or tenderness, no cervical motion tenderness, rectovaginal septum normal, uterus normal size, shape, and consistency and vagina normal without discharge Extremities: extremities normal, atraumatic, no cyanosis or edema Pulses: 2+ and symmetric Skin: Skin color, texture, turgor normal. No rashes or lesions Lymph nodes: Cervical, supraclavicular, and axillary nodes normal. Neurologic: Alert and oriented X 3, normal strength and tone. Normal symmetric reflexes. Normal coordination and gait    Assessment:    Healthy female exam.     Plan:     See After Visit Summary for Counseling Recommendations  Keep up a regular exercise program and make sure you are eating a healthy diet Try to eat 4 servings of dairy a day, or if you are lactose intolerant take a calcium with vitamin D daily.  Your vaccines are up to date.   Irregular periods. - Will check hormone levels and thyroid. Fathe rhad thyroid disease.   Tob abuse - she has quit smoking which is fantastic.

## 2013-02-20 NOTE — Patient Instructions (Addendum)
Keep up a regular exercise program and make sure you are eating a healthy diet Try to eat 4 servings of dairy a day, or if you are lactose intolerant take a calcium with vitamin D daily.  Your vaccines are up to date.   

## 2013-02-20 NOTE — Addendum Note (Signed)
Addended by: Deno EtienneBARKLEY, Thao Bauza L on: 02/20/2013 02:46 PM   Modules accepted: Orders

## 2013-02-24 LAB — COMPLETE METABOLIC PANEL WITH GFR
ALBUMIN: 4.4 g/dL (ref 3.5–5.2)
ALT: 10 U/L (ref 0–35)
AST: 13 U/L (ref 0–37)
Alkaline Phosphatase: 61 U/L (ref 39–117)
BUN: 11 mg/dL (ref 6–23)
CALCIUM: 9.6 mg/dL (ref 8.4–10.5)
CHLORIDE: 102 meq/L (ref 96–112)
CO2: 27 meq/L (ref 19–32)
Creat: 0.54 mg/dL (ref 0.50–1.10)
GLUCOSE: 91 mg/dL (ref 70–99)
POTASSIUM: 4.2 meq/L (ref 3.5–5.3)
Sodium: 138 mEq/L (ref 135–145)
Total Bilirubin: 0.5 mg/dL (ref 0.3–1.2)
Total Protein: 6.8 g/dL (ref 6.0–8.3)

## 2013-02-24 LAB — ESTRADIOL: ESTRADIOL: 123.8 pg/mL

## 2013-02-24 LAB — LIPID PANEL
CHOLESTEROL: 167 mg/dL (ref 0–200)
HDL: 57 mg/dL (ref >39)
LDL Cholesterol: 95 mg/dL (ref 0–99)
Total CHOL/HDL Ratio: 2.9 Ratio
Triglycerides: 73 mg/dL (ref <150)
VLDL: 15 mg/dL (ref 0–40)

## 2013-02-24 LAB — TSH: TSH: 1.982 u[IU]/mL (ref 0.350–4.500)

## 2013-02-24 LAB — LUTEINIZING HORMONE: LH: 27.7 m[IU]/mL

## 2013-02-24 LAB — FOLLICLE STIMULATING HORMONE: FSH: 8.7 m[IU]/mL

## 2013-02-24 LAB — PROGESTERONE: Progesterone: 1.4 ng/mL

## 2013-03-16 ENCOUNTER — Telehealth: Payer: Self-pay | Admitting: *Deleted

## 2013-03-16 NOTE — Telephone Encounter (Signed)
Pt called and stated that her Ph is off. Called pt back unable to reach lvm.Loralee PacasBarkley, Yifan Auker [place]

## 2013-12-01 ENCOUNTER — Other Ambulatory Visit (HOSPITAL_COMMUNITY)
Admission: RE | Admit: 2013-12-01 | Discharge: 2013-12-01 | Disposition: A | Discharge status: Home / Self Care | Payer: 59 | Source: Ambulatory Visit | Attending: Family Medicine | Admitting: Family Medicine

## 2013-12-01 ENCOUNTER — Encounter: Payer: Self-pay | Admitting: Family Medicine

## 2013-12-01 ENCOUNTER — Ambulatory Visit (INDEPENDENT_AMBULATORY_CARE_PROVIDER_SITE_OTHER): Payer: 59 | Admitting: Family Medicine

## 2013-12-01 VITALS — BP 125/83 | HR 76 | Wt 161.0 lb

## 2013-12-01 DIAGNOSIS — R87810 Cervical high risk human papillomavirus (HPV) DNA test positive: Secondary | ICD-10-CM | POA: Diagnosis not present

## 2013-12-01 DIAGNOSIS — Z1151 Encounter for screening for human papillomavirus (HPV): Secondary | ICD-10-CM | POA: Insufficient documentation

## 2013-12-01 DIAGNOSIS — Z124 Encounter for screening for malignant neoplasm of cervix: Secondary | ICD-10-CM

## 2013-12-01 DIAGNOSIS — Z01411 Encounter for gynecological examination (general) (routine) with abnormal findings: Secondary | ICD-10-CM | POA: Diagnosis not present

## 2013-12-01 DIAGNOSIS — Z113 Encounter for screening for infections with a predominantly sexual mode of transmission: Secondary | ICD-10-CM | POA: Insufficient documentation

## 2013-12-01 DIAGNOSIS — Z01419 Encounter for gynecological examination (general) (routine) without abnormal findings: Secondary | ICD-10-CM

## 2013-12-01 NOTE — Progress Notes (Signed)
   Subjective:    Patient ID: Holly HurtJami L Fulp, female    DOB: 06/18/1985, 28 y.o.   MRN: [id number]  Gynecologic Exam   She is here today for a Pap smear only. She denies any pelvic pain, problems, abnormal periods, or discharge. She is sexually active and does request STD testing.   Review of Systems     Objective:   Physical Exam  Constitutional: She is oriented to person, place, and time. She appears well-developed and well-nourished.  HENT:  Head: Normocephalic and atraumatic.  Genitourinary: Vagina normal and uterus normal. There is no rash, tenderness, lesion or injury on the right labia. There is no rash, tenderness or injury on the left labia. Cervix exhibits no motion tenderness, no discharge and no friability. Right adnexum displays no mass, no tenderness and no fullness. Left adnexum displays no mass, no tenderness and no fullness.  Neurological: She is oriented to person, place, and time.  Skin: Skin is warm and dry.  Psychiatric: She has a normal mood and affect.          Assessment & Plan:  Cervical cancer screening-Pap smear performed. We will call her with results in one week. We did add GC and Chlamydia testing to her Pap smear. Next paragraph STD testing-also add HIV and RPR bloodwork.

## 2013-12-02 LAB — RPR

## 2013-12-02 LAB — HIV ANTIBODY (ROUTINE TESTING W REFLEX): HIV: NONREACTIVE

## 2013-12-03 LAB — CYTOLOGY - PAP

## 2013-12-08 ENCOUNTER — Telehealth: Payer: Self-pay | Admitting: *Deleted

## 2013-12-08 DIAGNOSIS — R87619 Unspecified abnormal cytological findings in specimens from cervix uteri: Secondary | ICD-10-CM

## 2013-12-08 NOTE — Telephone Encounter (Signed)
referal placed to GYn for abnormal pap. Pt wanted to know what it means to have abnormal an abnormal pap. I told her that it means some cells are abnormal and depending on how abnormal they are a bx of cervix may be obtained (colposcopy) I told her that this would be discussed at her appt. Pt voiced undertanding

## 2013-12-09 ENCOUNTER — Other Ambulatory Visit: Payer: Self-pay | Admitting: Family Medicine

## 2013-12-09 ENCOUNTER — Telehealth: Payer: Self-pay | Admitting: *Deleted

## 2013-12-09 DIAGNOSIS — R87612 Low grade squamous intraepithelial lesion on cytologic smear of cervix (LGSIL): Secondary | ICD-10-CM

## 2013-12-09 NOTE — Telephone Encounter (Signed)
Pt called back and would like to be scheduled w/[place]. [place]. Will inform referral team of change to be made and send pt's notes to Dr. Senaida Oresichardson.Holly PacasBarkley, Holly Rivas [place]

## 2013-12-09 NOTE — Telephone Encounter (Signed)
Pt called and lvm asking about the results of her pap. What does it mean, and was told about a GYN referral but hasn't heard anything. I called her back lvm for her to call back. Told her that it looked like she had spoken with Sue Lushndrea about this.Loralee PacasBarkley, Theus Espin [place]

## 2014-11-18 ENCOUNTER — Encounter: Payer: 59 | Admitting: Family Medicine

## 2014-12-17 ENCOUNTER — Emergency Department (INDEPENDENT_AMBULATORY_CARE_PROVIDER_SITE_OTHER)
Admission: EM | Admit: 2014-12-17 | Discharge: 2014-12-17 | Disposition: A | Discharge status: Home / Self Care | Payer: BLUE CROSS/BLUE SHIELD | Source: Home / Self Care | Attending: Family Medicine | Admitting: Family Medicine

## 2014-12-17 ENCOUNTER — Encounter: Payer: Self-pay | Admitting: *Deleted

## 2014-12-17 DIAGNOSIS — M549 Dorsalgia, unspecified: Secondary | ICD-10-CM | POA: Diagnosis not present

## 2014-12-17 LAB — POCT URINALYSIS DIP (MANUAL ENTRY)
Bilirubin, UA: NEGATIVE
Glucose, UA: NEGATIVE
Ketones, POC UA: NEGATIVE
Leukocytes, UA: NEGATIVE
Nitrite, UA: NEGATIVE
Protein Ur, POC: NEGATIVE
Spec Grav, UA: 1.025 (ref 1.005–1.03)
Urobilinogen, UA: 0.2 (ref 0–1)
pH, UA: 7 (ref 5–8)

## 2014-12-17 MED ORDER — TRAMADOL HCL 50 MG PO TABS: 50.0000 mg | ORAL_TABLET | Freq: Four times a day (QID) | ORAL | Status: DC | PRN

## 2014-12-17 MED ORDER — MELOXICAM 7.5 MG PO TABS: 15.0000 mg | ORAL_TABLET | Freq: Every day | ORAL | Status: DC

## 2014-12-17 MED ORDER — CYCLOBENZAPRINE HCL 10 MG PO TABS: 10.0000 mg | ORAL_TABLET | Freq: Two times a day (BID) | ORAL | Status: DC | PRN

## 2014-12-17 NOTE — Discharge Instructions (Signed)
Tramadol is strong pain medication. While taking, do not drink alcohol, drive, or perform any other activities that requires focus while taking these medications.  ° °Meloxicam (Mobic) is an antiinflammatory to help with pain and inflammation.  Do not take ibuprofen, Advil, Aleve, or any other medications that contain NSAIDs while taking meloxicam as this may cause stomach upset or even ulcers if taken in large amounts for an extended period of time.  ° ° °

## 2014-12-17 NOTE — ED Provider Notes (Signed)
CSN: [id number]     Arrival date & time 12/17/14  1225 History   First MD Initiated Contact with Patient 12/17/14 1258     Chief Complaint  Patient presents with  . Flank Pain   (Consider location/radiation/quality/duration/timing/severity/associated sxs/prior Treatment) HPI  Pt is a 29yo female [facility] with c/o Right sided mid back/flank pain that started this morning.  Pain is aching sore, occasionally sharp.  Pain is 4/10 at worst. She has not tried anything for pain yet.  Pt's menstrual cycle started yesterday but this pain is higher than normal cramping.  Denies fever, chills, n/v/d. Denies hx of kidney stones but reports FH of kidney stones. Denies urinary symptoms. Denies heavy lifting or falls. Denies numbness or tingling in arms or legs.  History reviewed. No pertinent past medical history. Past Surgical History  Procedure Laterality Date  . No past surgeries     Family History  Problem Relation Age of Onset  . COPD Father   . Heart failure Father   . Thyroid disease Father   . Fibromyalgia Mother    Social History  Substance Use Topics  . Smoking status: Current Every Day Smoker -- 0.50 packs/day    Types: Cigarettes  . Smokeless tobacco: None  . Alcohol Use: No   OB History    No data available     Review of Systems  Constitutional: Negative for fever and chills.  Respiratory: Positive for cough. Negative for shortness of breath.   Gastrointestinal: Negative for nausea, vomiting, abdominal pain and diarrhea.  Genitourinary: Positive for flank pain (Right) and vaginal bleeding ( on menstraul cycle). Negative for dysuria, urgency, frequency, hematuria, vaginal discharge, vaginal pain, menstrual problem and pelvic pain.  Musculoskeletal: Positive for myalgias and back pain ( Right mid back).  Skin: Negative for color change and rash.    Allergies  Review of patient's allergies indicates no known allergies.  Home Medications   Prior to Admission  medications   Medication Sig Start Date End Date Taking? Authorizing Provider  cyclobenzaprine (FLEXERIL) 10 MG tablet Take 1 tablet (10 mg total) by mouth 2 (two) times daily as needed for muscle spasms. 12/17/14   Junius FinnerErin O'Malley, PA-C  meloxicam (MOBIC) 7.5 MG tablet Take 2 tablets (15 mg total) by mouth daily. 12/17/14   Junius FinnerErin O'Malley, PA-C  traMADol (ULTRAM) 50 MG tablet Take 1 tablet (50 mg total) by mouth every 6 (six) hours as needed. 12/17/14   Junius FinnerErin O'Malley, PA-C   Meds Ordered and Administered this Visit  Medications - No data to display  BP 110/73 mmHg  Pulse 67  Temp(Src) 98.4 F (36.9 C) (Oral)  Resp 16  Ht 5\' 7"  (1.702 m)  Wt 168 lb (76.204 kg)  BMI 26.31 kg/m2  SpO2 99%  LMP 12/16/2014 No data found.   Physical Exam  Constitutional: She is oriented to person, place, and time. She appears well-developed and well-nourished.  HENT:  Head: Normocephalic and atraumatic.  Eyes: EOM are normal.  Neck: Normal range of motion.  Cardiovascular: Normal rate and normal heart sounds.   Pulmonary/Chest: Effort normal and breath sounds normal. No respiratory distress. She has no wheezes. She has no rales. She exhibits no tenderness.  Abdominal: Soft. She exhibits no distension and no mass. There is no tenderness. There is CVA tenderness ( Right vs muscular tenderness). There is no rebound and no guarding.  Musculoskeletal: Normal range of motion. She exhibits tenderness. She exhibits no edema.  No midline spinal tenderness. Tenderness to  Right mid back muscles. Full ROM upper and lower extremities with 5/5 strength bilaterally.  Neurological: She is alert and oriented to person, place, and time.  Skin: Skin is warm and dry. No rash noted. No erythema.  Psychiatric: She has a normal mood and affect. Her behavior is normal.  Nursing note and vitals reviewed.   ED Course  Procedures (including critical care time)  Labs Review Labs Reviewed  POCT URINALYSIS DIP (MANUAL ENTRY) -  Abnormal; Notable for the following:    Blood, UA moderate (*)    All other components within normal limits    Imaging Review No results found.    MDM   1. Mid back pain on right side     Pt c/o Right mid back pain.  Tenderness over thoracic muscles on exam.  UA: blood in urine c/w pt being on cycle, otherwise normal  Symptoms likely muscular in nature Rx: tramadol, meloxicam, and flexeril Home care instructions provided. Encouraged alternating ice and heat F/u with PCP in 1 week if not improving, sooner if worsening.  Patient verbalized understanding and agreement with treatment plan.   Junius Finner, PA-C 12/17/14 [phone]

## 2014-12-17 NOTE — ED Notes (Signed)
Pt c/o RT flank pain x today with activity. Denies injury, fever or dysuria.

## 2015-05-05 ENCOUNTER — Ambulatory Visit (INDEPENDENT_AMBULATORY_CARE_PROVIDER_SITE_OTHER): Payer: BLUE CROSS/BLUE SHIELD | Admitting: Osteopathic Medicine

## 2015-05-05 ENCOUNTER — Encounter: Payer: Self-pay | Admitting: Osteopathic Medicine

## 2015-05-05 VITALS — BP 131/72 | HR 70 | Temp 98.1°F | Ht 67.0 in | Wt 178.0 lb

## 2015-05-05 DIAGNOSIS — J111 Influenza due to unidentified influenza virus with other respiratory manifestations: Secondary | ICD-10-CM | POA: Diagnosis not present

## 2015-05-05 DIAGNOSIS — J069 Acute upper respiratory infection, unspecified: Secondary | ICD-10-CM

## 2015-05-05 DIAGNOSIS — J101 Influenza due to other identified influenza virus with other respiratory manifestations: Secondary | ICD-10-CM

## 2015-05-05 DIAGNOSIS — R69 Illness, unspecified: Secondary | ICD-10-CM

## 2015-05-05 DIAGNOSIS — R05 Cough: Secondary | ICD-10-CM

## 2015-05-05 DIAGNOSIS — B9789 Other viral agents as the cause of diseases classified elsewhere: Secondary | ICD-10-CM

## 2015-05-05 DIAGNOSIS — R059 Cough, unspecified: Secondary | ICD-10-CM

## 2015-05-05 LAB — POCT INFLUENZA A/B
INFLUENZA B, POC: POSITIVE — AB
Influenza A, POC: NEGATIVE

## 2015-05-05 MED ORDER — GUAIFENESIN-CODEINE 100-10 MG/5ML PO SYRP: 5.0000 mL | ORAL_SOLUTION | Freq: Three times a day (TID) | ORAL | Status: DC | PRN

## 2015-05-05 MED ORDER — OSELTAMIVIR PHOSPHATE 75 MG PO CAPS: 75.0000 mg | ORAL_CAPSULE | Freq: Two times a day (BID) | ORAL | Status: DC

## 2015-05-05 MED ORDER — BENZONATATE 200 MG PO CAPS: 200.0000 mg | ORAL_CAPSULE | Freq: Three times a day (TID) | ORAL | Status: DC | PRN

## 2015-05-05 MED ORDER — IPRATROPIUM BROMIDE 0.03 % NA SOLN
2.0000 | Freq: Three times a day (TID) | NASAL | Status: DC
Start: 2015-05-05 — End: 2015-05-12

## 2015-05-05 NOTE — Progress Notes (Signed)
HPI: Holly Rivas is a 30 y.o. female who [facility]  today for chief complaint of:  Chief Complaint  Patient presents with  . Cough   . Quality: deep cough . Assoc signs/symptoms: see ROS . Duration: 2 days . Modifying factors: has tried the following OTC/Rx medications: none   Past medical, social and family history reviewed. Current medications and allergies reviewed.     Review of Systems: CONSTITUTIONAL: subjective fever/chills HEAD/EYES/EARS/NOSE/THROAT: yes headache, no vision change or hearing change, yes sore throat CARDIAC: No chest pain/pressure/palpitations RESPIRATORY: yes cough, no shortness of breath GASTROINTESTINAL: no nausea, no vomiting, no abdominal pain, no diarrhea MUSCULOSKELETAL: yes myalgia/arthralgia  No flu vax    Exam:  BP 131/72 mmHg  Pulse 70  Temp(Src) 98.1 F (36.7 C) (Oral)  Ht 5\' 7"  (1.702 m)  Wt 178 lb (80.74 kg)  BMI 27.87 kg/m2 Constitutional: VSS, see above. General Appearance: alert, well-developed, well-nourished, NAD Eyes: Normal lids and conjunctive, non-icteric sclera, PERRLA Ears, Nose, Mouth, Throat: Normal external inspection ears/nares/mouth/lips/gums, normal TM, MMM;       posterior pharynx without erythema, without exudate Neck: No masses, trachea midline. abnormal - mild tender but not enlarged lymph nodes Respiratory: Normal respiratory effort. No  wheeze/rhonchi/rales Cardiovascular: S1/S2 normal, no murmur/rub/gallop auscultated. RRR.   Results for orders placed or performed in visit on 05/05/15 (from the past 72 hour(s))  POCT Influenza A/B     Status: Abnormal   Collection Time: 05/05/15  1:51 PM  Result Value Ref Range   Influenza A, POC Negative Negative   Influenza B, POC Positive (A) Negative   No results found.   ASSESSMENT/PLAN:  Influenza B - Plan: oseltamivir (TAMIFLU) 75 MG capsule  Influenza-like illness - Plan: POCT Influenza A/B  Cough - Plan:  benzonatate (TESSALON) 200 MG capsule, guaiFENesin-codeine (ROBITUSSIN AC) 100-10 MG/5ML syrup  Viral URI with cough - Plan: benzonatate (TESSALON) 200 MG capsule, ipratropium (ATROVENT) 0.03 % nasal spray, guaiFENesin-codeine (ROBITUSSIN AC) 100-10 MG/5ML syrup    Return if symptoms worsen or fail to improve.

## 2015-05-05 NOTE — Patient Instructions (Signed)
DR. Jessicca Stitzer'S HOME CARE INSTRUCTIONS: VIRAL ILLNESSES - ACHES/PAINS, SORE THROAT, SINUSITIS, COUGH, GASTRITIS   FRIST, A FEW NOTES ON OVER-THE-COUNTER MEDICATIONS!   USE CAUTION - MANY OVER-THE-COUNTER MEDICATIONS COME IN COMBINATIONS OF MULTIPLE GENERIC MEDICINES. FOR INSTANCE, NYQUIL HAS TYLENOL + COUGH MEDICINE + AN ANTIHISTAMINE, SO BE CAREFUL YOU'RE NOT TAKING A COMBINATION MEDICINE WHICH CONTAINS MEDICATIONS YOU'RE ALSO TAKING SEPARATELY (LIKE NYQUIL SYRUP PLUS TYLENOL PILLS).   YOUR PHARMACIST CAN HELP YOU AVOID MEDICATION INTERACTIONS AND DUPLICATIONS - ASK FOR THEIR HELP IF YOU ARE CONFUSED OR UNSURE ABOUT WHAT TO PURCHASE OVER-THE-COUNTER!   REMEMBER - IF YOU'RE EVER CONCERNED ABOUT MEDICATION SIDE EFFECTS, OR IF YOU'RE EVER CONCERNED YOUR SYMPTOMS ARE GETTING WORSE DESPITE TREATMENT, PLEASE CALL THE DOCTOR'S OFFICE! IF AFTER-HOURS, YOU CAN BE SEEN IN URGENT CARE OR, FOR SEVERE ILLNESS, PLEASE GO TO THE EMERGENCY ROOM.   TREAT SINUS CONGESTION, RUNNY NOSE & POSTNASAL DRIP:  Treat to increase airflow through sinuses, decrease congestion pain and prevent bacterial growth!   Remember, only 0.5-2% of sinus infections are due to a bacteria, the rest are due to a virus (usually the common cold) which will not get better with antibiotics!  NASAL SPRAYS: generally safe and should not interact with other medicines. Can take any of these medications, either alone or together... FLONASE (FLUTICASONE) - 2 sprays in each nostril twice per day (also a great allergy medicine to use long-term!) AFRIN (OXYMETOLAZONE) - use sparingly because it will cause rebound congestion, NEVER USE IN KIDS  SALINE NASAL SPRAY- no limit, but avoid use after other nasal sprays or it can wash the medicine away PRESCRTIPTION ATROVENT - as directed on prescription bottle  ANTIHISTAMINES: Helps dry out runny nose and decreases postnasal drip. Benadryl may cause drowsiness but other preparations should not be  as sedating. Certain kinds are not as safe in elderly individuals. OK to use unless Dr A says otherwise.  Only use one of the following... BENADRYL (DIPHENHYDRAMINE) - 25-50 mg every 6 hours ZYRTEC (CETIRIZINE) - 5-10 mg daily CLARITIN (LORATIDINE) - less potent. 10 mg daily ALLEGRA (FEXOFENADINE) - least likely to cause drowsiness! 180 mg daily or 60 mg twice per day  DECONGESTANTS: Helps dry out runny nose and helps with sinus pain. May cause insomnia, or sometimes elevated heart rate. Can cause problems if used often in people with high blood pressure. OK to use unless Dr A says otherwise. NEVER USE IN KIDS UNDER 2 YEARS OLD. Only use one of the following... SUDAFED (PSEUDOEPHEDINE) - 60 mg every 4 - 6 hours, also comes in 120 mg extended release every 12 hours, maximum 240 mg in 24 hours.  SUDAED PE (PHENYLEPHRINE) - 10 mg every 4 - 6 hours, maximum 60 mg per day  COMBINATIONS OF ABOVE ANTIHISTAMINES + DECONGESTANTS: these usually require you to show your ID at the pharmacy counter. You can also purchase these medicines separately as noted above.  Only use one of the following... ZYRTEC-D (CETIRIZINE + PSEUDOEPHEDRINE) - 12 hour formulation as directed CLARITIN-D (LORATIDINE + PSEUDOEPHEDRINE) - 12 and 24 hour formulations as directed ALLEGRA-D (FEXOFENADINE + PSEUDOEPHEDRINE) - 12 and 24 hour formulations as directed  PRESCRIPTION TREATMENT FOR SINUS PROBLEMS: Can include nasal sprays, pills, or antibiotics in the case of true bacterial infection. Not everyone needs an antibiotic but there are other medicines which will help you feel better while your body fights the infection!    TREAT COUGH & SORE THROAT:  Remember, cough is the body's way of protecting your   airways and lungs, it's a hard-wired reflex that is tough for medicines to treat!   Irritation to the airways will cause cough. This irritation is usually caused by upper airway problems like postnasal drip (treat as above)  and viral sore throat, but in severe cases can be due to lower airway problems like bronchitis or pneumonia, which a doctor can usually hear on exam of your lungs or see on an X-ray.  Sore throat is almost always due to a virus, but occasionally caused by certain strains of Strep, which requires antibiotics.   Exercise and smoking may make cough worse - take it easy while you're sick, and QUIT SMOKING!   Cough due to viral infection can linger for 2-3 weeks or so. If you're coughing longer than you think you should, or if the cough is severe, please make an appointment in the office - you may need a chest X-ray.  EXPECTORANT: Used to help clear airways, take these with PLENTY of water to help thin mucus secretions and make the mucus easier to cough up  Only use one of the following... ROBITUSSIN (DEXTROMETHORPHAN OR GUAIFENISEN depending on formulation)  MUCINEX (GUAIFENICEN) - usually longer acting  COUGH DROPS/LOZENGES: Whichever over-the-counter agent you prefer!  Here are some suggestions for ingredients to look for (can take both)... BENZOCAINE - numbing effect, also in CHLORASEPTIC THROAT SPRAY MENTHOL - cooling effect  HONEY: has gone head-to-head in several clinical trials with prescription cough medicines and found to be equally effective! Try 1 Teaspoon Honey + 2 Drops Lemon Juice, as much as you want to use. NONE FOR KIDS UNDER AGE 2  HERBAL TEA: There are certain ingredients which help "coat the throat" to relieve pain  such as ELM BARK, LICORICE ROOT, MARSHMALLOW ROOT  PRESCRIPTION TREATMENT FOR COUGH: Reserved for severe cases. Can include pills, syrups or inhalers.    TREAT ACHES & PAINS, FEVER:  Illness causes aches, pains and fever as your body increases its natural inflammation response to help fight the infection.   Rest, good hydration and nutrition, and taking anti-inflammatory medications will help.   Remember: a true fever is a temperature 100.4 or  higher. If you have a fever that is 105.0 or higher, that is a dangerous level and needs medical attention in the office or in the ER!  Can take both of these together if it's ok with your doctor... IBUPROFEN - 400-800 mg every 6 - 8 hours. Ibuprofen and similar medications can cause problems for people with heart disease or history of stomach ulcers, check with Dr A first if you're concerned. Lower doses are usually safe and effective.  TYLENOL (ACETAMINOPHEN) - 500-1000 mg every 6 hours. It won't cause problems with heart or stomach.     TREAT GASTROINTESTINAL SYMPTOMS:  Hydrate, hydrate, hydrate! Try drinking Gatorade/Powerade, broth/soup. Avoid milk and juice, these can make diarrhea worse. You can drink water, of course, but if you are also having vomiting and loose stool you are losing electrolytes which water alone can't replace.  IMMODIUM (LOPERAMIDE) - as directed on the bottle to help with loose stool PRESCRIPTION ZOFRAN (ONDANSETRON) OR PHENERGAN (PROMETHAZINE) - as directed to help nausea and vomiting. Try taking these before you eat if you are having trouble keeping food down.     REMEMBER - THE MOST IMPORTANT THINGS YOU CAN DO TO AVOID CATCHING OR SPREADING ILLNESS INCLUDE:   COVER YOUR COUGH WITH YOUR ARM, NOT WITH YOUR HANDS!   DISINFECT COMMONLY USED SURFACES (SUCH AS   TELEPHONES & DOORKNOBS) WHEN YOU OR SOMEONE CLOSE TO YOU IS FEELING SICK!   BE SURE VACCINES ARE UP TO DATE - GET A FLU SHOT EVERY YEAR! THE FLU CAN BE DEADLY FOR BABIES, ELDERLY FOLKS, AND PEOPLE WITH WEAK IMMUNE SYSTEMS - YOU SHOULD BE VACCINATED TO HELP PREVENT YOURSELF FROM GETTING SICK, BUT ALSO TO PREVENT SOMEONE ELSE FROM GETTING AN INFECTION WHICH MAY HOSPITALIZE OR KILL THEM.  GOOD NUTRITION AND HEALTHY LIFESTYLE WILL HELP YOUR IMMUNE SYSTEM YEAR-ROUND! THERE IS NO MAGIC SUPPLEMENT!  AND ABOVE ALL - WASH YOUR HANDS OFTEN!   

## 2015-05-12 ENCOUNTER — Encounter: Payer: Self-pay | Admitting: Family Medicine

## 2015-05-12 ENCOUNTER — Ambulatory Visit (INDEPENDENT_AMBULATORY_CARE_PROVIDER_SITE_OTHER): Payer: BLUE CROSS/BLUE SHIELD | Admitting: Family Medicine

## 2015-05-12 ENCOUNTER — Other Ambulatory Visit (HOSPITAL_COMMUNITY)
Admission: RE | Admit: 2015-05-12 | Discharge: 2015-05-12 | Disposition: A | Discharge status: Home / Self Care | Payer: BLUE CROSS/BLUE SHIELD | Source: Ambulatory Visit | Attending: Family Medicine | Admitting: Family Medicine

## 2015-05-12 VITALS — BP 112/76 | HR 70 | Wt 175.0 lb

## 2015-05-12 DIAGNOSIS — Z0000 Encounter for general adult medical examination without abnormal findings: Secondary | ICD-10-CM | POA: Diagnosis not present

## 2015-05-12 DIAGNOSIS — Z01419 Encounter for gynecological examination (general) (routine) without abnormal findings: Secondary | ICD-10-CM | POA: Diagnosis not present

## 2015-05-12 DIAGNOSIS — N760 Acute vaginitis: Secondary | ICD-10-CM | POA: Insufficient documentation

## 2015-05-12 LAB — LIPID PANEL
Cholesterol: 174 mg/dL (ref 125–200)
HDL: 49 mg/dL (ref >=46)
LDL Cholesterol: 111 mg/dL (ref <130)
TRIGLYCERIDES: 71 mg/dL (ref <150)
Total CHOL/HDL Ratio: 3.6 Ratio (ref <=5.0)
VLDL: 14 mg/dL (ref <30)

## 2015-05-12 LAB — CBC
HCT: 41 % (ref 36.0–46.0)
Hemoglobin: 13.7 g/dL (ref 12.0–15.0)
MCH: 31.9 pg (ref 26.0–34.0)
MCHC: 33.4 g/dL (ref 30.0–36.0)
MCV: 95.3 fL (ref 78.0–100.0)
MPV: 12.3 fL (ref 8.6–12.4)
Platelets: 211 10*3/uL (ref 150–400)
RBC: 4.3 MIL/uL (ref 3.87–5.11)
RDW: 13.4 % (ref 11.5–15.5)
WBC: 5.3 10*3/uL (ref 4.0–10.5)

## 2015-05-12 LAB — COMPREHENSIVE METABOLIC PANEL
ALBUMIN: 4.2 g/dL (ref 3.6–5.1)
ALT: 16 U/L (ref 6–29)
AST: 24 U/L (ref 10–30)
Alkaline Phosphatase: 51 U/L (ref 33–115)
BUN: 14 mg/dL (ref 7–25)
CHLORIDE: 104 mmol/L (ref 98–110)
CO2: 27 mmol/L (ref 20–31)
CREATININE: 0.55 mg/dL (ref 0.50–1.10)
Calcium: 9.1 mg/dL (ref 8.6–10.2)
Glucose, Bld: 88 mg/dL (ref 65–99)
POTASSIUM: 4.5 mmol/L (ref 3.5–5.3)
SODIUM: 140 mmol/L (ref 135–146)
TOTAL PROTEIN: 6.5 g/dL (ref 6.1–8.1)
Total Bilirubin: 0.6 mg/dL (ref 0.2–1.2)

## 2015-05-12 LAB — TSH: TSH: 1.21 m[IU]/L

## 2015-05-12 NOTE — Addendum Note (Signed)
Addended by: Deno EtienneBARKLEY, Emilyrose Darrah L on: 05/12/2015 09:52 AM   Modules accepted: Orders

## 2015-05-12 NOTE — Patient Instructions (Signed)
Keep up a regular exercise program and make sure you are eating a healthy diet Try to eat 4 servings of dairy a day, or if you are lactose intolerant take a calcium with vitamin D daily.  Your vaccines are up to date.   

## 2015-05-12 NOTE — Progress Notes (Signed)
  Subjective:     Holly Rivas is a 30 y.o. female and is here for a comprehensive physical exam. The patient reports no problems.  She is doing well overall. She is now down to only smoking 3 cigarettes per day. She's been going to gym 5 days per week with 3 friends at work and working out. She's been doing great. No specific concerns today. No chest pain or breathing palms with exercise. She did not get a flu shot this year in fact said she actually had the flu about a week ago.  Social History   Social History  . Marital Status: Single    Spouse Name: N/A  . Number of Children: N/A  . Years of Education: N/A   Occupational History  . Not on file.   Social History Main Topics  . Smoking status: Current Every Day Smoker -- 0.50 packs/day    Types: Cigarettes  . Smokeless tobacco: Not on file  . Alcohol Use: No  . Drug Use: No  . Sexual Activity:    Partners: Male   Other Topics Concern  . Not on file   Social History Narrative   Working out 2 times a week.    Health Maintenance  Topic Date Due  . INFLUENZA VACCINE  05/12/2016 (Originally 09/13/2014)  . PAP SMEAR  12/01/2016  . TETANUS/TDAP  02/21/2023  . HIV Screening  Completed    The following portions of the patient's history were reviewed and updated as appropriate: allergies, current medications, past family history, past medical history, past social history, past surgical history and problem list.  Review of Systems A comprehensive review of systems was negative.   Objective:    BP 112/76 mmHg  Pulse 70  Wt 175 lb (79.379 kg)  SpO2 98%  LMP 05/07/2015 (Exact Date) General appearance: alert Head: Normocephalic, without obvious abnormality, atraumatic Eyes: conj clear, EOMI, PEERLA Ears: normal TM's and external ear canals both ears Nose: Nares normal. Septum midline. Mucosa normal. No drainage or sinus tenderness. Throat: lips, mucosa, and tongue normal; teeth and gums normal Neck: no adenopathy, no carotid  bruit, no JVD, supple, symmetrical, trachea midline and thyroid not enlarged, symmetric, no tenderness/mass/nodules Back: symmetric, no curvature. ROM normal. No CVA tenderness. Lungs: clear to auscultation bilaterally Breasts: normal appearance, no masses or tenderness Heart: regular rate and rhythm, S1, S2 normal, no murmur, click, rub or gallop Abdomen: soft, non-tender; bowel sounds normal; no masses,  no organomegaly Pelvic: cervix normal in appearance, external genitalia normal, no adnexal masses or tenderness, no cervical motion tenderness, rectovaginal septum normal, uterus normal size, shape, and consistency and vagina normal without discharge Extremities: extremities normal, atraumatic, no cyanosis or edema Pulses: 2+ and symmetric Skin: Skin color, texture, turgor normal. No rashes or lesions Lymph nodes: Cervical, supraclavicular, and axillary nodes normal. Neurologic: Alert and oriented X 3, normal strength and tone. Normal symmetric reflexes. Normal coordination and gait    Assessment:    Healthy female exam.      Plan:     See After Visit Summary for Counseling Recommendations  Keep up a regular exercise program and make sure you are eating a healthy diet Try to eat 4 servings of dairy a day, or if you are lactose intolerant take a calcium with vitamin D daily.  Your vaccines are up to date.   tob abuse - Encourage smoking cessation.

## 2015-05-12 NOTE — Progress Notes (Signed)
Quick Note:  All labs are normal. ______ 

## 2015-05-13 LAB — CYTOLOGY - PAP

## 2015-05-14 NOTE — Progress Notes (Signed)
Quick Note:  Call patient: Your Pap smear is normal. Repeat in 3 years. ______ 

## 2015-05-16 LAB — CERVICOVAGINAL ANCILLARY ONLY: CANDIDA VAGINITIS: NEGATIVE

## 2015-07-11 ENCOUNTER — Emergency Department (INDEPENDENT_AMBULATORY_CARE_PROVIDER_SITE_OTHER)
Admission: EM | Admit: 2015-07-11 | Discharge: 2015-07-11 | Disposition: A | Discharge status: Home / Self Care | Payer: BLUE CROSS/BLUE SHIELD | Source: Home / Self Care | Attending: Family Medicine | Admitting: Family Medicine

## 2015-07-11 ENCOUNTER — Encounter: Payer: Self-pay | Admitting: Emergency Medicine

## 2015-07-11 DIAGNOSIS — J039 Acute tonsillitis, unspecified: Secondary | ICD-10-CM | POA: Diagnosis not present

## 2015-07-11 LAB — POCT RAPID STREP A (OFFICE): Rapid Strep A Screen: NEGATIVE

## 2015-07-11 LAB — POCT CBC W AUTO DIFF (K'VILLE URGENT CARE)

## 2015-07-11 LAB — POCT MONO SCREEN (KUC): Mono, POC: NEGATIVE

## 2015-07-11 MED ORDER — PREDNISONE 20 MG PO TABS: ORAL_TABLET | ORAL | Status: DC

## 2015-07-11 MED ORDER — DEXAMETHASONE SODIUM PHOSPHATE 10 MG/ML IJ SOLN
10.0000 mg | Freq: Once | INTRAMUSCULAR | Status: AC
  Administered 2015-07-11: 10 mg via INTRAMUSCULAR

## 2015-07-11 MED ORDER — MAGIC MOUTHWASH W/LIDOCAINE: 10.0000 mL | Freq: Three times a day (TID) | ORAL | Status: DC | PRN

## 2015-07-11 NOTE — Discharge Instructions (Signed)
You may take 400-600mg  Ibuprofen (Motrin) every 6-8 hours for fever and pain  Alternate with Tylenol  You may take  Tylenol every 4-6 hours as needed for fever and pain  Follow-up with your primary care provider next week for recheck of symptoms if not improving.  Be sure to drink plenty of fluids and rest, at least 8hrs of sleep a night, preferably more while you are sick. Return urgent care or go to closest ER if you cannot keep down fluids/signs of dehydration, fever not reducing with Tylenol, difficulty breathing/wheezing, stiff neck, worsening condition, or other concerns (see below)  You were given a shot of decadron (a steroid) today to help with itching and swelling from a likely allergic reaction.  You have been prescribed 5 days of prednisone, an oral steroid.  You may start this medication tomorrow with breakfast.   Tonsillitis Tonsillitis is an infection of the throat that causes the tonsils to become red, tender, and swollen. Tonsils are collections of lymphoid tissue at the back of the throat. Each tonsil has crevices (crypts). Tonsils help fight nose and throat infections and keep infection from spreading to other parts of the body for the first 18 months of life.  CAUSES Sudden (acute) tonsillitis is usually caused by infection with streptococcal bacteria. Long-lasting (chronic) tonsillitis occurs when the crypts of the tonsils become filled with pieces of food and bacteria, which makes it easy for the tonsils to become repeatedly infected. SYMPTOMS  Symptoms of tonsillitis include:  A sore throat, with possible difficulty swallowing.  White patches on the tonsils.  Fever.  Tiredness.  New episodes of snoring during sleep, when you did not snore before.  Small, foul-smelling, yellowish-white pieces of material (tonsilloliths) that you occasionally cough up or spit out. The tonsilloliths can also cause you to have bad breath. DIAGNOSIS Tonsillitis can be diagnosed  through a physical exam. Diagnosis can be confirmed with the results of lab tests, including a throat culture. TREATMENT  The goals of tonsillitis treatment include the reduction of the severity and duration of symptoms and prevention of associated conditions. Symptoms of tonsillitis can be improved with the use of steroids to reduce the swelling. Tonsillitis caused by bacteria can be treated with antibiotic medicines. Usually, treatment with antibiotic medicines is started before the cause of the tonsillitis is known. However, if it is determined that the cause is not bacterial, antibiotic medicines will not treat the tonsillitis. If attacks of tonsillitis are severe and frequent, your health care provider may recommend surgery to remove the tonsils (tonsillectomy). HOME CARE INSTRUCTIONS   Rest as much as possible and get plenty of sleep.  Drink plenty of fluids. While the throat is very sore, eat soft foods or liquids, such as sherbet, soups, or instant breakfast drinks.  Eat frozen ice pops.  Gargle with a warm or cold liquid to help soothe the throat. Mix 1/4 teaspoon of salt and 1/4 teaspoon of baking soda in 8 oz of water. SEEK MEDICAL CARE IF:   Large, tender lumps develop in your neck.  A rash develops.  A green, yellow-brown, or bloody substance is coughed up.  You are unable to swallow liquids or food for 24 hours.  You notice that only one of the tonsils is swollen. SEEK IMMEDIATE MEDICAL CARE IF:   You develop any new symptoms such as vomiting, severe headache, stiff neck, chest pain, or trouble breathing or swallowing.  You have severe throat pain along with drooling or voice changes.  You  have severe pain, unrelieved with recommended medications.  You are unable to fully open the mouth.  You develop redness, swelling, or severe pain anywhere in the neck.  You have a fever. MAKE SURE YOU:   Understand these instructions.  Will watch your condition.  Will get  help right away if you are not doing well or get worse.   This information is not intended to replace advice given to you by your health care provider. Make sure you discuss any questions you have with your health care provider.   Document Released: 11/08/2004 Document Revised: 02/19/2014 Document Reviewed: 07/18/2012 Elsevier Interactive Patient Education [employer]2016 [employer]

## 2015-07-11 NOTE — ED Provider Notes (Signed)
CSN: [id number]     Arrival date & time 07/11/15  1834 History   First MD Initiated Contact with Patient 07/11/15 1850     Chief Complaint  Patient presents with  . Sore Throat   (Consider location/radiation/quality/duration/timing/severity/associated sxs/prior Treatment) HPI The pt is a 30yo female presenting to [facility] with c/o sore throat, fever Tmax 101.7*F, and white spots on tonsils for 3 days. Pain is aching and sore, 5/10, worse with swallowing but she is able to keep down fluids, denies difficulty breathing.  Denies n/v/d. No sick contacts or recent travel.   History reviewed. No pertinent past medical history. Past Surgical History  Procedure Laterality Date  . No past surgeries     Family History  Problem Relation Age of Onset  . COPD Father   . Heart failure Father   . Thyroid disease Father   . Fibromyalgia Mother    Social History  Substance Use Topics  . Smoking status: Current Every Day Smoker -- 0.50 packs/day    Types: Cigarettes  . Smokeless tobacco: None  . Alcohol Use: No   OB History    No data available     Review of Systems  Constitutional: Positive for fever and fatigue. Negative for chills.  HENT: Positive for sore throat. Negative for congestion, ear pain, trouble swallowing and voice change.   Respiratory: Negative for cough and shortness of breath.   Cardiovascular: Negative for chest pain and palpitations.  Gastrointestinal: Negative for nausea, vomiting, abdominal pain and diarrhea.  Musculoskeletal: Positive for myalgias and arthralgias. Negative for back pain.       Body aches  Skin: Negative for rash.  Neurological: Positive for headaches. Negative for dizziness and light-headedness.    Allergies  Review of patient's allergies indicates no known allergies.  Home Medications   Prior to Admission medications   Medication Sig Start Date End Date Taking? Authorizing Provider  magic mouthwash w/lidocaine SOLN Take 10 mLs by mouth 3 (three)  times daily as needed for mouth pain. Swish gargle and spit 07/11/15   Junius Finner, PA-C  predniSONE (DELTASONE) 20 MG tablet 3 tabs po day one, then 2 po daily x 4 days 07/11/15   Junius Finner, PA-C   Meds Ordered and Administered this Visit   Medications  dexamethasone (DECADRON) injection 10 mg (not administered)    BP 125/79 mmHg  Pulse 116  Temp(Src) 99.9 F (37.7 C) (Oral)  Wt 168 lb (76.204 kg)  SpO2 98%  LMP 06/21/2015 No data found.   Physical Exam  Constitutional: She appears well-developed and well-nourished. No distress.  HENT:  Head: Normocephalic and atraumatic.  Right Ear: Tympanic membrane normal.  Left Ear: Tympanic membrane normal.  Nose: Nose normal.  Mouth/Throat: Uvula is midline and mucous membranes are normal. No trismus in the jaw. Oropharyngeal exudate, posterior oropharyngeal edema and posterior oropharyngeal erythema present. No tonsillar abscesses.  Eyes: Conjunctivae are normal. No scleral icterus.  Neck: Normal range of motion. Neck supple.  Cardiovascular: Normal rate, regular rhythm and normal heart sounds.   Pulmonary/Chest: Effort normal and breath sounds normal. No stridor. No respiratory distress. She has no wheezes. She has no rales. She exhibits no tenderness.  Abdominal: Soft. She exhibits no distension. There is no tenderness.  Musculoskeletal: Normal range of motion.  Lymphadenopathy:    She has cervical adenopathy.  Neurological: She is alert.  Skin: Skin is warm and dry. She is not diaphoretic.  Nursing note and vitals reviewed.   ED Course  Procedures (  including critical care time)  Labs Review Labs Reviewed  EPSTEIN-BARR VIRUS EARLY D ANTIGEN ANTIBODY, IGG  EPSTEIN-BARR VIRUS NUCLEAR ANTIGEN ANTIBODY, IGG  EPSTEIN-BARR VIRUS VCA, IGG  EPSTEIN-BARR VIRUS VCA, IGM  POCT RAPID STREP A (OFFICE)  POCT MONO SCREEN ([facility])  POCT CBC W AUTO DIFF ([facility])    Imaging Review No results found.    MDM   1.  Tonsillitis with exudate    Exam c/w tonsilitis with exudate. No evidence of peritonsillar abscess at this time.  Rapid strep and mono: Negative Strep culture and mono tests pending.  Will treat symptomatically  Decadron 10mg  IM given in UC Rx: prednisone and magic mouthwash  Encouraged fluids, rest, acetaminophen and ibuprofen. F/u with PCP in 4-5 days if not improving, sooner if worsening. Patient verbalized understanding and agreement with treatment plan.     Junius Finnerrin O'Malley, PA-C 07/11/15 1945

## 2015-07-11 NOTE — ED Notes (Signed)
Pt c/o fever, sore throat and white spots on tonsils. x3 days.

## 2015-07-12 ENCOUNTER — Telehealth: Payer: Self-pay | Admitting: *Deleted

## 2015-07-12 LAB — STREP A DNA PROBE: GASP: NOT DETECTED

## 2015-07-12 NOTE — ED Notes (Signed)
LM with Tcx results and to call back if she has any questions or concerns.  

## 2015-07-13 ENCOUNTER — Encounter: Payer: Self-pay | Admitting: Physician Assistant

## 2015-07-13 ENCOUNTER — Telehealth: Payer: Self-pay | Admitting: Family Medicine

## 2015-07-13 ENCOUNTER — Other Ambulatory Visit: Payer: Self-pay

## 2015-07-13 ENCOUNTER — Ambulatory Visit (INDEPENDENT_AMBULATORY_CARE_PROVIDER_SITE_OTHER): Payer: BLUE CROSS/BLUE SHIELD | Admitting: Physician Assistant

## 2015-07-13 VITALS — BP 109/63 | HR 102 | Temp 100.3°F | Ht 67.0 in | Wt 167.0 lb

## 2015-07-13 DIAGNOSIS — J038 Acute tonsillitis due to other specified organisms: Secondary | ICD-10-CM

## 2015-07-13 LAB — CBC WITH DIFFERENTIAL/PLATELET
BASOS ABS: 0 {cells}/uL (ref 0–200)
Basophils Relative: 0 %
EOS ABS: 0 {cells}/uL — AB (ref 15–500)
EOS PCT: 0 %
HCT: 38.2 % (ref 35.0–45.0)
HEMOGLOBIN: 13.1 g/dL (ref 11.7–15.5)
Lymphocytes Relative: 8 %
Lymphs Abs: 784 cells/uL — ABNORMAL LOW (ref 850–3900)
MCH: 32.3 pg (ref 27.0–33.0)
MCHC: 34.3 g/dL (ref 32.0–36.0)
MCV: 94.1 fL (ref 80.0–100.0)
MONOS PCT: 8 %
MPV: 11.7 fL (ref 7.5–12.5)
Monocytes Absolute: 784 cells/uL (ref 200–950)
NEUTROS ABS: 8232 {cells}/uL — AB (ref 1500–7800)
NEUTROS PCT: 84 %
PLATELETS: 186 10*3/uL (ref 140–400)
RBC: 4.06 MIL/uL (ref 3.80–5.10)
RDW: 12.6 % (ref 11.0–15.0)
WBC: 9.8 10*3/uL (ref 3.8–10.8)

## 2015-07-13 LAB — EPSTEIN-BARR VIRUS EARLY D ANTIGEN ANTIBODY, IGG: EBV EA IgG: 11.2 U/mL — ABNORMAL HIGH (ref <9.0)

## 2015-07-13 LAB — EPSTEIN-BARR VIRUS NUCLEAR ANTIGEN ANTIBODY, IGG: EBV NA IgG: 86.5 U/mL — ABNORMAL HIGH (ref <18.0)

## 2015-07-13 LAB — EPSTEIN-BARR VIRUS VCA, IGM: EBV VCA IgM: <10 U/mL (ref <36.0)

## 2015-07-13 LAB — EPSTEIN-BARR VIRUS VCA, IGG: EBV VCA IgG: 198 U/mL — ABNORMAL HIGH (ref <18.0)

## 2015-07-13 LAB — HIV ANTIBODY (ROUTINE TESTING W REFLEX): HIV: NONREACTIVE

## 2015-07-13 MED ORDER — AMOXICILLIN-POT CLAVULANATE 875-125 MG PO TABS: 1.0000 | ORAL_TABLET | Freq: Two times a day (BID) | ORAL | Status: DC

## 2015-07-13 MED ORDER — CEFTRIAXONE SODIUM 1 G IJ SOLR
1.0000 g | Freq: Once | INTRAMUSCULAR | Status: AC
  Administered 2015-07-13: 1 g via INTRAMUSCULAR

## 2015-07-13 NOTE — Patient Instructions (Signed)

## 2015-07-13 NOTE — Progress Notes (Signed)
   Subjective:    Patient ID: Holly HurtJami L Fulp, female    DOB: 04/22/1985, 30 y.o.   MRN: [id number]  HPI Pt is a 30 yo with 6 days of fever, ST and swollen tonsils. She was seen in UC on 5/29 and given solumedrol and sent home with prednisone. Shot made worse so she did not take prednisone. She continues to run a fever 100-101. She is taking tylenol and ibuprofen. She cannot eat or drink. All swallowing hurts. She denies any drooling or difficultly breathing. She is concerned about STD causing this.   Review of Systems  All other systems reviewed and are negative.      Objective:   Physical Exam  Constitutional: She is oriented to person, place, and time. She appears well-developed and well-nourished.  HENT:  Head: Normocephalic and atraumatic.  Right Ear: External ear normal.  Left Ear: External ear normal.  Nose: Nose normal.  Left tonsil enlarged 2+ hypertrophy with exudate and larger than right at 1+. Uvula midline.    Eyes: Conjunctivae are normal. Right eye exhibits no discharge. Left eye exhibits no discharge.  Neck: Normal range of motion. Neck supple.  Tender anterior cervical adenopathy bilaterally worse on left than right.   Cardiovascular: Normal rate, regular rhythm and normal heart sounds.   Pulmonary/Chest: Effort normal and breath sounds normal. She has no wheezes.  Neurological: She is alert and oriented to person, place, and time.  Psychiatric: She has a normal mood and affect. Her behavior is normal.          Assessment & Plan:  Acute tonsillitis with exudate- viral swab for herpes done. HIV, mono, CBC ordered today. Rocephin 1g given today. augmentin sent for 10 days to start tomorrow. Symptomatic care discussed. Concerned for peritonsillar abscess but uvula midline and no red flag symptoms. Follow up on Friday for recheck.

## 2015-07-13 NOTE — Telephone Encounter (Signed)
Patient was seen today and walked back in and request to have her [facility] changed [facility] [address] and req to have [facility] that were sent today [facility] [address] [facility] [facility]. Thanks

## 2015-07-13 NOTE — ED Notes (Signed)
Callback: Pt lab results given and discussed. She was seen by PCP today and given antibiotics as she is not any better.

## 2015-07-14 ENCOUNTER — Telehealth: Payer: Self-pay

## 2015-07-14 LAB — MONONUCLEOSIS SCREEN: HETEROPHILE, MONO SCREEN: NEGATIVE

## 2015-07-14 NOTE — Telephone Encounter (Signed)
After reviewing the chart it looks like there was some concern for possible peritonsillar abscess though there was no deviation of the uvula. That she is getting worse I recommend that she go to emergency department immediately. Patient was notified by Cherylann ParrAngela Tuttle.

## 2015-07-14 NOTE — Telephone Encounter (Signed)
Holly Rivas called and states it is harder to swallow today. She also has fever and chills. She was advised to call back if she wasn't better. She has taken two doses of the Augmentin. She has a follow up appointment in the morning with Jade. Can she wait until this appointment or should she be seen today.

## 2015-07-15 ENCOUNTER — Ambulatory Visit: Payer: BLUE CROSS/BLUE SHIELD | Admitting: Physician Assistant

## 2015-07-15 NOTE — Telephone Encounter (Signed)
I called and left a message for patient to go to ED. Patient's mom called back and stated she did go to the  ED.

## 2015-07-15 NOTE — Telephone Encounter (Signed)
I called and left a message this morning asking if patient was still in the hospital. She has an appointment this morning and I need to know if I should cancel the appointment.

## 2015-07-19 ENCOUNTER — Encounter: Payer: Self-pay | Admitting: Physician Assistant

## 2015-07-19 ENCOUNTER — Ambulatory Visit (INDEPENDENT_AMBULATORY_CARE_PROVIDER_SITE_OTHER): Payer: BLUE CROSS/BLUE SHIELD | Admitting: Physician Assistant

## 2015-07-19 VITALS — BP 109/67 | HR 85 | Wt 162.0 lb

## 2015-07-19 DIAGNOSIS — H9201 Otalgia, right ear: Secondary | ICD-10-CM | POA: Diagnosis not present

## 2015-07-19 DIAGNOSIS — J039 Acute tonsillitis, unspecified: Secondary | ICD-10-CM | POA: Diagnosis not present

## 2015-07-19 NOTE — Patient Instructions (Addendum)
Will make ENT referral today. Call if don't here anything from us today.   flonase 2 spray each nostril.  Ibuprofen up to 800mg  up to three times a day.

## 2015-07-19 NOTE — Progress Notes (Signed)
   Subjective:    Patient ID: Holly HurtJami L Rivas, female    DOB: 12/01/1985, 30 y.o.   MRN: [id number]  HPI Pt is a 30 yo female who presents to the clinic for ongoing tonsillitis. Her symptoms started a few days before 07/11/15 when she went to UC. She was given prednisone and symptomatic treatment. She continue to get worse. She was seen here on 07/13/15 with worsening symptoms. She was started on augmentin for 10 days. There was a concern for peri-tonsilar abscess since left tonsilar was much larger than right. She called back on 07/14/15 and was sent to ER. She was given steroids and IV abx with pain relieving mouthwash. She was told to continue on augmentin. She is feeling better overall today but she still continues to have ST and exudate on tonsils as well as right ear pain. No fever, chills, body aches, or problems swallowing. Has 5 days of augmentin left. She is on her last day of steriod.    Review of Systems  All other systems reviewed and are negative.      Objective:   Physical Exam  Constitutional: She is oriented to person, place, and time. She appears well-developed and well-nourished.  HENT:  Head: Normocephalic and atraumatic.  Right Ear: External ear normal.  Left Ear: External ear normal.  Nose: Nose normal.  Mouth/Throat: No oropharyngeal exudate.  Left 1+ hypertrophy with white exudate. Uvula midline. Oropharynx erythematous.  Bilateral TM clear. +light reflex.   Eyes: Conjunctivae are normal. Right eye exhibits no discharge. Left eye exhibits no discharge.  Neck: Normal range of motion. Neck supple.  Left non-tender anterior cervical lymphadenopathy.   Cardiovascular: Normal rate, regular rhythm and normal heart sounds.   Pulmonary/Chest: Effort normal and breath sounds normal. She has no wheezes.  Neurological: She is alert and oriented to person, place, and time.  Psychiatric: She has a normal mood and affect. Her behavior is normal.          Assessment & Plan:   Acute tonsillitis/right ear pain- I would like for patient to be seen by ENT this has been 2 weeks of symptoms and missed work. Concerned this episode could make her more prone to exacerbations. Ibuprofen for ST/inflammation. Finish augmentin. flonase for right ear pain, reassured patient no infection seen.  Filled out paperwork for work missed 5/29-6/6.

## 2015-07-25 ENCOUNTER — Encounter: Payer: Self-pay | Admitting: Physician Assistant

## 2015-07-25 DIAGNOSIS — B004 Herpesviral encephalitis: Secondary | ICD-10-CM | POA: Insufficient documentation

## 2015-07-25 LAB — RFX HSV/VARICELLA ZOSTER RAPID CULT

## 2015-07-25 LAB — REFLEX ADENOVIRUS CULTURE

## 2015-07-26 ENCOUNTER — Other Ambulatory Visit: Payer: Self-pay | Admitting: *Deleted

## 2015-07-26 MED ORDER — VALACYCLOVIR HCL 1 G PO TABS: 2000.0000 mg | ORAL_TABLET | Freq: Two times a day (BID) | ORAL | Status: DC

## 2015-07-27 LAB — CYTOMEGALOVIRUS CULTURE

## 2015-07-27 LAB — VIRAL CULTURE VIRC

## 2016-12-03 ENCOUNTER — Ambulatory Visit (INDEPENDENT_AMBULATORY_CARE_PROVIDER_SITE_OTHER): Payer: BLUE CROSS/BLUE SHIELD | Admitting: Family Medicine

## 2016-12-03 VITALS — BP 116/64 | HR 87 | Temp 98.2°F | Ht 67.0 in | Wt 156.0 lb

## 2016-12-03 DIAGNOSIS — J329 Chronic sinusitis, unspecified: Secondary | ICD-10-CM

## 2016-12-03 DIAGNOSIS — N760 Acute vaginitis: Secondary | ICD-10-CM | POA: Diagnosis not present

## 2016-12-03 DIAGNOSIS — J40 Bronchitis, not specified as acute or chronic: Secondary | ICD-10-CM | POA: Diagnosis not present

## 2016-12-03 MED ORDER — ALBUTEROL SULFATE HFA 108 (90 BASE) MCG/ACT IN AERS: 2.0000 | INHALATION_SPRAY | Freq: Four times a day (QID) | RESPIRATORY_TRACT | 0 refills | Status: DC | PRN

## 2016-12-03 MED ORDER — FLUCONAZOLE 150 MG PO TABS: 150.0000 mg | ORAL_TABLET | Freq: Once | ORAL | 1 refills | Status: AC

## 2016-12-03 MED ORDER — AZITHROMYCIN 250 MG PO TABS: ORAL_TABLET | ORAL | 0 refills | Status: DC

## 2016-12-03 MED ORDER — AZITHROMYCIN 250 MG PO TABS: ORAL_TABLET | ORAL | 0 refills | Status: AC

## 2016-12-03 NOTE — Progress Notes (Signed)
   Subjective:    Patient ID: Holly Rivas, female    DOB: 03/28/1985, 31 y.o.   MRN: [id number]  HPI  31 year old female comes in for respiratory symptoms.  She says they started about 3 weeks ago.  Initially she had laryngitis and as the laryngitis improved over several days she actually started to develop a cough.  She said she had one day where she started to feel better but then started feeling worse again.  She says now she is getting a lot of congestion and sticky slimy mucus in her chest.  She says she is not even been able to lie flat to sleep at night.  She has been having to sleep sitting up.  She did take an antibiotic that was left over from her previous illness initially about 3 weeks ago when her symptoms first started.  She said she took them for about 5 or 6 days but really did not get any relief.  Is been using some over-the-counter counter cough and cold medication.  It really does not seem to help.  Her mom gave her her albuterol and that actually does seem to help particularly if she gets into a coughing fit and then feel short for short of breath.  She has not had a fever in about a week.  No chills or sweats.  She has also has significant nasal congestion and pressure across her nasal bridge and underneath both eyes.  That has been persistent for about 2 weeks as well.  She took the antibiotics about 3 weeks ago she has had vaginal irritation and itching.  She says is very common for her to get yeast vaginitis after a round of antibiotics.    Review of Systems       Objective:   Physical Exam  Constitutional: She is oriented to person, place, and time. She appears well-developed and well-nourished.  HENT:  Head: Normocephalic and atraumatic.  Right Ear: External ear normal.  Left Ear: External ear normal.  Nose: Nose normal.  Mouth/Throat: Oropharynx is clear and moist.  TMs and canals are clear.   Eyes: Pupils are equal, round, and reactive to light. Conjunctivae and  EOM are normal.  Neck: Neck supple. No thyromegaly present.  Cardiovascular: Normal rate, regular rhythm and normal heart sounds.   Pulmonary/Chest: Effort normal and breath sounds normal. She has no wheezes.  Lymphadenopathy:    She has no cervical adenopathy.  Neurological: She is alert and oriented to person, place, and time.  Skin: Skin is warm and dry.  Psychiatric: She has a normal mood and affect.        Assessment & Plan:  Acute sinobronchits -we will treat with azithromycin.  Offered to prescribe Jerilynn Somessalon Perles but she says they do not usually work.  So okay to continue the over-the-counter cough medication.  Also refill the albuterol since that does provide some relief.  It sounds like she is getting a lot of bronchospasm with this as well.  Call if not significantly better in 1 week.  Explained that when she took the antibiotics about 3 weeks ago she took it too prematurely.  She likely had a virus at that point.  I explained that pharyngitis is almost always a virus.  Vaginitis -  Prep performed.  We will call her with results once available.  I did go ahead and send over prescription for Diflucan.

## 2016-12-03 NOTE — Patient Instructions (Addendum)
Call if not better by Friday.    Acute Bronchitis, Adult Acute bronchitis is sudden (acute) swelling of the air tubes (bronchi) in the lungs. Acute bronchitis causes these tubes to fill with mucus, which can make it hard to breathe. It can also cause coughing or wheezing. In adults, acute bronchitis usually goes away within 2 weeks. A cough caused by bronchitis may last up to 3 weeks. Smoking, allergies, and asthma can make the condition worse. Repeated episodes of bronchitis may cause further lung problems, such as chronic obstructive pulmonary disease (COPD). What are the causes? This condition can be caused by germs and by substances that irritate the lungs, including:  Cold and flu viruses. This condition is most often caused by the same virus that causes a cold.  Bacteria.  Exposure to tobacco smoke, dust, fumes, and air pollution.  What increases the risk? This condition is more likely to develop in people who:  Have close contact with someone with acute bronchitis.  Are exposed to lung irritants, such as tobacco smoke, dust, fumes, and vapors.  Have a weak immune system.  Have a respiratory condition such as asthma.  What are the signs or symptoms? Symptoms of this condition include:  A cough.  Coughing up clear, yellow, or green mucus.  Wheezing.  Chest congestion.  Shortness of breath.  A fever.  Body aches.  Chills.  A sore throat.  How is this diagnosed? This condition is usually diagnosed with a physical exam. During the exam, your health care provider may order tests, such as chest X-rays, to rule out other conditions. He or she may also:  Test a sample of your mucus for bacterial infection.  Check the level of oxygen in your blood. This is done to check for pneumonia.  Do a chest X-ray or lung function testing to rule out pneumonia and other conditions.  Perform blood tests.  Your health care provider will also ask about your symptoms and  medical history. How is this treated? Most cases of acute bronchitis clear up over time without treatment. Your health care provider may recommend:  Drinking more fluids. Drinking more makes your mucus thinner, which may make it easier to breathe.  Taking a medicine for a fever or cough.  Taking an antibiotic medicine.  Using an inhaler to help improve shortness of breath and to control a cough.  Using a cool mist vaporizer or humidifier to make it easier to breathe.  Follow these instructions at home: Medicines  Take over-the-counter and prescription medicines only as told by your health care provider.  If you were prescribed an antibiotic, take it as told by your health care provider. Do not stop taking the antibiotic even if you start to feel better. General instructions  Get plenty of rest.  Drink enough fluids to keep your urine clear or pale yellow.  Avoid smoking and secondhand smoke. Exposure to cigarette smoke or irritating chemicals will make bronchitis worse. If you smoke and you need help quitting, ask your health care provider. Quitting smoking will help your lungs heal faster.  Use an inhaler, cool mist vaporizer, or humidifier as told by your health care provider.  Keep all follow-up visits as told by your health care provider. This is important. How is this prevented? To lower your risk of getting this condition again:  Wash your hands often with soap and water. If soap and water are not available, use hand sanitizer.  Avoid contact with people who have cold symptoms.  Try not to touch your hands to your mouth, nose, or eyes.  Make sure to get the flu shot every year.  Contact a health care provider if:  Your symptoms do not improve in 2 weeks of treatment. Get help right away if:  You cough up blood.  You have chest pain.  You have severe shortness of breath.  You become dehydrated.  You faint or keep feeling like you are going to faint.  You  keep vomiting.  You have a severe headache.  Your fever or chills gets worse. This information is not intended to replace advice given to you by your health care provider. Make sure you discuss any questions you have with your health care provider. Document Released: 03/08/2004 Document Revised: 08/24/2015 Document Reviewed: 07/20/2015 Elsevier Interactive Patient Education  2017 [employer]

## 2016-12-04 LAB — WET PREP FOR TRICH, YEAST, CLUE
MICRO NUMBER:: 81178307
Specimen Quality: ADEQUATE

## 2016-12-04 MED ORDER — METRONIDAZOLE 500 MG PO TABS: 500.0000 mg | ORAL_TABLET | Freq: Two times a day (BID) | ORAL | 0 refills | Status: DC

## 2016-12-04 NOTE — Addendum Note (Signed)
Addended by: Nani GasserMETHENEY, Lydell Moga D on: 12/04/2016 11:50 AM   Modules accepted: Orders

## 2019-09-08 ENCOUNTER — Ambulatory Visit: Payer: Self-pay

## 2019-09-10 ENCOUNTER — Ambulatory Visit: Payer: Self-pay

## 2019-09-12 ENCOUNTER — Ambulatory Visit: Payer: Self-pay | Attending: Internal Medicine

## 2019-09-12 ENCOUNTER — Ambulatory Visit: Payer: Self-pay

## 2019-09-12 DIAGNOSIS — Z23 Encounter for immunization: Secondary | ICD-10-CM

## 2019-09-12 NOTE — Progress Notes (Signed)
   Covid-19 Vaccination Clinic  Name:  Holly Rivas    MRN: [id number] DOB: January 04, 1986  09/12/2019  Holly Rivas was observed post Covid-19 immunization for 15 minutes without incident. She was provided with Vaccine Information Sheet and instruction to access the V-Safe system.   Holly Rivas was instructed to call 911 with any severe reactions post vaccine: Marland Kitchen Difficulty breathing  . Swelling of face and throat  . A fast heartbeat  . A bad rash all over body  . Dizziness and weakness   Immunizations Administered    Name Date Dose VIS Date Route   Pfizer COVID-19 Vaccine 09/12/2019  9:29 AM 0.3 mL 04/08/2018 Intramuscular   Manufacturer: [employer], [employer]   Lot: [id number]   NDC: [address]-0

## 2019-10-20 ENCOUNTER — Ambulatory Visit: Payer: Self-pay

## 2020-01-26 LAB — OB RESULTS CONSOLE RUBELLA ANTIBODY, IGM: Rubella: NON-IMMUNE/NOT IMMUNE

## 2020-01-26 LAB — OB RESULTS CONSOLE GC/CHLAMYDIA
Chlamydia: NEGATIVE
Gonorrhea: NEGATIVE

## 2020-01-29 LAB — OB RESULTS CONSOLE RUBELLA ANTIBODY, IGM: Rubella: IMMUNE

## 2020-01-29 LAB — OB RESULTS CONSOLE HIV ANTIBODY (ROUTINE TESTING): HIV: NONREACTIVE

## 2020-01-29 LAB — OB RESULTS CONSOLE HEPATITIS B SURFACE ANTIGEN: Hepatitis B Surface Ag: NEGATIVE

## 2020-01-30 LAB — HM PAP SMEAR
HM Pap smear: NEGATIVE
HPV Aptima: NEGATIVE

## 2020-02-13 NOTE — L&D Delivery Note (Signed)
Delivery Note Pt pushed well for 20 mins. Develerations noted with pushing but moderate variability and accels in between contractions and great descent noted. At 6:39 AM a viable female was delivered via Vaginal, Spontaneous (Presentation:   Occiput Anterior).  APGAR: 9, 9; weight  pending Loose nuchal x 1 reduced over infants head. Anterior and posterior shoulders delivered easily next; body followed    Placenta status: Spontaneous, Intact. Duncan Cord: 3 vessels with the following complications: None.  Cord pH: n/a  Anesthesia: Epidural Episiotomy: None Lacerations: 2nd degree;Vaginal Suture Repair: 2.0 vicryl Est. Blood Loss (mL): 200  Mom to postpartum.  Baby to Couplet care / Skin to Skin They desire circumcision for baby.  Cathrine Muster 08/09/2020, 7:04 AM

## 2020-02-22 ENCOUNTER — Other Ambulatory Visit: Payer: Self-pay

## 2020-02-22 DIAGNOSIS — Z20822 Contact with and (suspected) exposure to covid-19: Secondary | ICD-10-CM

## 2020-02-25 LAB — NOVEL CORONAVIRUS, NAA: SARS-CoV-2, NAA: DETECTED — AB

## 2020-02-25 LAB — SARS-COV-2, NAA 2 DAY TAT

## 2020-07-19 LAB — OB RESULTS CONSOLE GBS: GBS: NEGATIVE

## 2020-07-29 ENCOUNTER — Telehealth (HOSPITAL_COMMUNITY): Payer: Self-pay | Admitting: *Deleted

## 2020-07-29 ENCOUNTER — Encounter (HOSPITAL_COMMUNITY): Payer: Self-pay | Admitting: *Deleted

## 2020-07-29 NOTE — Telephone Encounter (Signed)
Preadmission screen  

## 2020-08-05 ENCOUNTER — Other Ambulatory Visit (HOSPITAL_COMMUNITY)
Admission: RE | Admit: 2020-08-05 | Discharge: 2020-08-05 | Disposition: A | Discharge status: Home / Self Care | Payer: Self-pay | Source: Ambulatory Visit | Attending: Obstetrics and Gynecology | Admitting: Obstetrics and Gynecology

## 2020-08-05 DIAGNOSIS — Z01812 Encounter for preprocedural laboratory examination: Secondary | ICD-10-CM | POA: Insufficient documentation

## 2020-08-05 DIAGNOSIS — Z20822 Contact with and (suspected) exposure to covid-19: Secondary | ICD-10-CM | POA: Insufficient documentation

## 2020-08-05 LAB — SARS CORONAVIRUS 2 (TAT 6-24 HRS): SARS Coronavirus 2: NEGATIVE

## 2020-08-08 ENCOUNTER — Inpatient Hospital Stay (HOSPITAL_COMMUNITY): Admission: AD | Admit: 2020-08-08 | Payer: Self-pay | Source: Home / Self Care | Admitting: Obstetrics and Gynecology

## 2020-08-08 ENCOUNTER — Encounter (HOSPITAL_COMMUNITY): Payer: Self-pay | Admitting: Obstetrics and Gynecology

## 2020-08-08 ENCOUNTER — Encounter (HOSPITAL_COMMUNITY): Payer: Self-pay | Admitting: Anesthesiology

## 2020-08-08 ENCOUNTER — Inpatient Hospital Stay (HOSPITAL_COMMUNITY)
Admission: AD | Admit: 2020-08-08 | Discharge: 2020-08-11 | DRG: 807 | Disposition: A | Discharge status: Home / Self Care | Payer: Self-pay | Attending: Obstetrics and Gynecology | Admitting: Obstetrics and Gynecology

## 2020-08-08 ENCOUNTER — Other Ambulatory Visit: Payer: Self-pay

## 2020-08-08 ENCOUNTER — Inpatient Hospital Stay (HOSPITAL_COMMUNITY): Payer: Self-pay | Attending: Obstetrics and Gynecology

## 2020-08-08 DIAGNOSIS — O24425 Gestational diabetes mellitus in childbirth, controlled by oral hypoglycemic drugs: Principal | ICD-10-CM | POA: Diagnosis present

## 2020-08-08 DIAGNOSIS — Z6791 Unspecified blood type, Rh negative: Secondary | ICD-10-CM

## 2020-08-08 DIAGNOSIS — Z3A39 39 weeks gestation of pregnancy: Secondary | ICD-10-CM

## 2020-08-08 DIAGNOSIS — O26893 Other specified pregnancy related conditions, third trimester: Secondary | ICD-10-CM | POA: Diagnosis present

## 2020-08-08 DIAGNOSIS — Z20822 Contact with and (suspected) exposure to covid-19: Secondary | ICD-10-CM | POA: Diagnosis present

## 2020-08-08 DIAGNOSIS — O24419 Gestational diabetes mellitus in pregnancy, unspecified control: Secondary | ICD-10-CM | POA: Diagnosis present

## 2020-08-08 LAB — CBC
HCT: 36.5 % (ref 36.0–46.0)
Hemoglobin: 12.2 g/dL (ref 12.0–15.0)
MCH: 31.7 pg (ref 26.0–34.0)
MCHC: 33.4 g/dL (ref 30.0–36.0)
MCV: 94.8 fL (ref 80.0–100.0)
Platelets: 166 10*3/uL (ref 150–400)
RBC: 3.85 MIL/uL — ABNORMAL LOW (ref 3.87–5.11)
RDW: 13.6 % (ref 11.5–15.5)
WBC: 10.1 10*3/uL (ref 4.0–10.5)
nRBC: 0 % (ref 0.0–0.2)

## 2020-08-08 LAB — TYPE AND SCREEN
ABO/RH(D): A NEG
Antibody Screen: POSITIVE

## 2020-08-08 LAB — GLUCOSE, CAPILLARY
Glucose-Capillary: 83 mg/dL (ref 70–99)
Glucose-Capillary: 88 mg/dL (ref 70–99)
Glucose-Capillary: 89 mg/dL (ref 70–99)
Glucose-Capillary: 91 mg/dL (ref 70–99)
Glucose-Capillary: 99 mg/dL (ref 70–99)

## 2020-08-08 LAB — RPR: RPR Ser Ql: NONREACTIVE

## 2020-08-08 MED ORDER — MISOPROSTOL 50MCG HALF TABLET
50.0000 ug | ORAL_TABLET | Freq: Once | ORAL | Status: AC
  Administered 2020-08-08: 50 ug via ORAL
  Filled 2020-08-08: qty 1

## 2020-08-08 MED ORDER — BUTORPHANOL TARTRATE 1 MG/ML IJ SOLN
1.0000 mg | INTRAMUSCULAR | Status: DC | PRN
  Administered 2020-08-08 (×2): 1 mg via INTRAVENOUS
  Filled 2020-08-08 (×2): qty 1

## 2020-08-08 MED ORDER — ACETAMINOPHEN 325 MG PO TABS: 650.0000 mg | ORAL_TABLET | ORAL | Status: DC | PRN

## 2020-08-08 MED ORDER — EPHEDRINE 5 MG/ML INJ: 10.0000 mg | INTRAVENOUS | Status: DC | PRN

## 2020-08-08 MED ORDER — OXYCODONE-ACETAMINOPHEN 5-325 MG PO TABS
1.0000 | ORAL_TABLET | ORAL | Status: DC | PRN
Start: 2020-08-08 — End: 2020-08-09

## 2020-08-08 MED ORDER — OXYTOCIN-SODIUM CHLORIDE 30-0.9 UT/500ML-% IV SOLN
1.0000 m[IU]/min | INTRAVENOUS | Status: DC
  Administered 2020-08-08 – 2020-08-09 (×2): 2 m[IU]/min via INTRAVENOUS
  Filled 2020-08-08: qty 500

## 2020-08-08 MED ORDER — FENTANYL-BUPIVACAINE-NACL 0.5-0.125-0.9 MG/250ML-% EP SOLN
12.0000 mL/h | EPIDURAL | Status: DC | PRN
  Administered 2020-08-09: 12 mL/h via EPIDURAL
  Filled 2020-08-08: qty 250

## 2020-08-08 MED ORDER — TERBUTALINE SULFATE 1 MG/ML IJ SOLN: 0.2500 mg | Freq: Once | INTRAMUSCULAR | Status: DC | PRN

## 2020-08-08 MED ORDER — PHENYLEPHRINE 40 MCG/ML (10ML) SYRINGE FOR IV PUSH (FOR BLOOD PRESSURE SUPPORT)
80.0000 ug | PREFILLED_SYRINGE | INTRAVENOUS | Status: AC | PRN
  Administered 2020-08-09 (×3): 80 ug via INTRAVENOUS

## 2020-08-08 MED ORDER — DIPHENHYDRAMINE HCL 50 MG/ML IJ SOLN: 12.5000 mg | INTRAMUSCULAR | Status: DC | PRN

## 2020-08-08 MED ORDER — LIDOCAINE HCL (PF) 1 % IJ SOLN: 30.0000 mL | INTRAMUSCULAR | Status: DC | PRN

## 2020-08-08 MED ORDER — LACTATED RINGERS IV SOLN: 500.0000 mL | INTRAVENOUS | Status: DC | PRN

## 2020-08-08 MED ORDER — OXYTOCIN BOLUS FROM INFUSION
333.0000 mL | Freq: Once | INTRAVENOUS | Status: AC
  Administered 2020-08-09: 333 mL via INTRAVENOUS

## 2020-08-08 MED ORDER — ONDANSETRON HCL 4 MG/2ML IJ SOLN
4.0000 mg | Freq: Four times a day (QID) | INTRAMUSCULAR | Status: DC | PRN
  Administered 2020-08-09: 4 mg via INTRAVENOUS
  Filled 2020-08-08: qty 2

## 2020-08-08 MED ORDER — OXYCODONE-ACETAMINOPHEN 5-325 MG PO TABS: 2.0000 | ORAL_TABLET | ORAL | Status: DC | PRN

## 2020-08-08 MED ORDER — PHENYLEPHRINE 40 MCG/ML (10ML) SYRINGE FOR IV PUSH (FOR BLOOD PRESSURE SUPPORT)
80.0000 ug | PREFILLED_SYRINGE | INTRAVENOUS | Status: DC | PRN
  Filled 2020-08-08: qty 10

## 2020-08-08 MED ORDER — OXYTOCIN-SODIUM CHLORIDE 30-0.9 UT/500ML-% IV SOLN: 2.5000 [IU]/h | INTRAVENOUS | Status: DC

## 2020-08-08 MED ORDER — SOD CITRATE-CITRIC ACID 500-334 MG/5ML PO SOLN: 30.0000 mL | ORAL | Status: DC | PRN

## 2020-08-08 MED ORDER — MISOPROSTOL 25 MCG QUARTER TABLET
25.0000 ug | ORAL_TABLET | ORAL | Status: DC | PRN
  Administered 2020-08-08 (×2): 25 ug via VAGINAL
  Filled 2020-08-08 (×3): qty 1

## 2020-08-08 MED ORDER — LACTATED RINGERS IV SOLN: INTRAVENOUS | Status: DC

## 2020-08-08 MED ORDER — LACTATED RINGERS IV SOLN: 500.0000 mL | Freq: Once | INTRAVENOUS | Status: DC

## 2020-08-08 NOTE — Progress Notes (Signed)
Patient ID: Holly Rivas, female   DOB: 1986-02-02, 35 y.o.   MRN: [id number] Cervix 1/50/-2 Cooks catherter placed with 60u/40v Cat 1 strip cytotec orally

## 2020-08-08 NOTE — Progress Notes (Signed)
Patient ID: Holly Rivas, female   DOB: 1985/06/02, 35 y.o.   MRN: [id number] Pt is appreciating contractions q . Rated at 7/10 then received stadol and now 5/10. Contractions were too frequent to warrant another dose of cytotec when due two hours ago Foley catheter still in place with tension Cat 1 strip, 145  Plan: Pt to eat a regular meal now given hasnt eaten in past 18hrs.           An hour after eating, if foley still in place then will start pitocin per protocol                                            If foley out then, will plan AROM with pitocin

## 2020-08-08 NOTE — Progress Notes (Signed)
Patient ID: Holly Rivas, female   DOB: [birth year], 35 y.o.   MRN: [id number] Foley bulb came out at 915pm. Pitocin was then further titrated up from . Pt reports contractions rated at 7/10 now.  At this time pt is now 5.5cm dil/ 80% effaced and -2 station EFM - 170s, moderate variability TOCO - irregular contractions.  VSS- afebrile   Plan: 1L LR bolus given          AROM done - clear fluid noted          FSE placed for better HR monitoring          Epidural now per pt request          If tachycardia persists will perform amnioinfusion next

## 2020-08-08 NOTE — H&P (Signed)
Holly Rivas is a 35 y.o.prime female presenting for IOL at 51 2/7wks due to [facility]. She was dated per LMP which was confirmed with a 9week [name]. Her pregnancy has been complicated by GDM - controlled on metformin 500mg  po daily. She is rubella non immune and rh negative. She developed covid infection in 02/2020 despite being vaccinated in 09/2019.  Growth scan in June noted 65%ile.  She is GBS negative and recent covid screen was negative.  She declined inheritest;; NIPT negative OB History     Gravida  1   Para      Term      Preterm      AB      Living         SAB      IAB      Ectopic      Multiple      Live Births             History reviewed. No pertinent past medical history. History reviewed. No pertinent surgical history. Family History: family history is not on file. Social History:  reports that she has never smoked. She has never used smokeless tobacco. She reports previous alcohol use. She reports that she does not use drugs.     Maternal Diabetes: Yes:  Diabetes Type:  Insulin/Medication controlled Genetic Screening: Normal Maternal Ultrasounds/Referrals: Normal Fetal Ultrasounds or other Referrals:  None Maternal Substance Abuse:  No Significant Maternal Medications:  Meds include: Other: metformin Significant Maternal Lab Results:  Group B Strep negative Other Comments:  None  Review of Systems  Constitutional:  Negative for activity change and fatigue.  Eyes:  Negative for photophobia and visual disturbance.  Respiratory:  Negative for chest tightness and shortness of breath.   Cardiovascular:  Positive for leg swelling. Negative for chest pain and palpitations.  Gastrointestinal:  Negative for abdominal pain.  Endocrine: Negative for polyphagia and polyuria.  Genitourinary:  Negative for pelvic pain.  Musculoskeletal:  Negative for back pain.  Neurological:  Negative for light-headedness and headaches.  Psychiatric/Behavioral:  The patient is not  nervous/anxious.   All other systems reviewed and are negative. Maternal Medical History:  Reason for admission: IOL- GDMA2  Contractions: Frequency: rare.   Perceived severity is mild.   Fetal activity: Perceived fetal activity is normal.   Prenatal complications: no prenatal complications Prenatal Complications - Diabetes: gestational. Diabetes is managed by oral agent (monotherapy).    Dilation: 1 Effacement (%): 50 Station: -2 Exam by:: L. [email or web address], RN Blood pressure 114/61, pulse 78, temperature 98.3 F (36.8 C), temperature source Oral, resp. rate 18, height 5\' 7"  (1.702 m), weight 110.3 kg, SpO2 98 %. Maternal Exam:  Uterine Assessment: Contraction strength is mild.  Contraction frequency is regular.  Abdomen: Patient reports generalized tenderness.  Estimated fetal weight is AGA 65%ile.   Introitus: Normal vulva. Normal vagina.  Pelvis: adequate for delivery.   Cervix: Cervix evaluated by digital exam.     Fetal Exam Fetal Monitor Review: Baseline rate: 140.  Variability: moderate (6-25 bpm).   Pattern: no decelerations and accelerations present.   Fetal State Assessment: Category I - tracings are normal.  Physical Exam Vitals and nursing note reviewed. Exam conducted with a chaperone present.  Pulmonary:     Effort: Pulmonary effort is normal.  Abdominal:     Tenderness: There is generalized abdominal tenderness.  Genitourinary:    General: Normal vulva.  Musculoskeletal:        General: Normal range of motion.  Cervical back: Normal range of motion.  Skin:    General: Skin is warm.     Capillary Refill: Capillary refill takes 2 to 3 seconds.  Neurological:     General: No focal deficit present.     Mental Status: She is alert and oriented to person, place, and time. Mental status is at baseline.  Psychiatric:        Mood and Affect: Mood normal.        Behavior: Behavior normal.        Thought Content: Thought content normal.    Prenatal labs: ABO, Rh:  --/--/A NEG (06/27 0033) Antibody: POS (06/27 0033) Rubella: Immune (12/17 0000) RPR:    HBsAg: Negative (12/17 0000)  HIV: Non-reactive (12/17 0000)  GBS: Negative/-- (06/07 0000)   Assessment/Plan: 34yo G1 at 39 2/7wks with GDMA2 for IOL -- Admitted - S/P two doses of cytotec overnight - GBS neg; covid neg - AROM/Pit when able - Anticipate svd          - FSBS q 4hrs       Janean Sark Kaya Klausing 08/08/2020, 9:28 AM

## 2020-08-09 ENCOUNTER — Inpatient Hospital Stay (HOSPITAL_COMMUNITY): Payer: Self-pay | Admitting: Anesthesiology

## 2020-08-09 ENCOUNTER — Encounter (HOSPITAL_COMMUNITY): Payer: Self-pay | Admitting: Obstetrics and Gynecology

## 2020-08-09 LAB — GLUCOSE, CAPILLARY: Glucose-Capillary: 99 mg/dL (ref 70–99)

## 2020-08-09 MED ORDER — SIMETHICONE 80 MG PO CHEW: 80.0000 mg | CHEWABLE_TABLET | ORAL | Status: DC | PRN

## 2020-08-09 MED ORDER — DIPHENHYDRAMINE HCL 50 MG/ML IJ SOLN: 12.5000 mg | INTRAMUSCULAR | Status: DC | PRN

## 2020-08-09 MED ORDER — ONDANSETRON HCL 4 MG/2ML IJ SOLN: 4.0000 mg | INTRAMUSCULAR | Status: DC | PRN

## 2020-08-09 MED ORDER — LIDOCAINE HCL (PF) 1 % IJ SOLN
INTRAMUSCULAR | Status: DC | PRN
  Administered 2020-08-09: 8 mL via EPIDURAL

## 2020-08-09 MED ORDER — ONDANSETRON HCL 4 MG PO TABS: 4.0000 mg | ORAL_TABLET | ORAL | Status: DC | PRN

## 2020-08-09 MED ORDER — PRENATAL MULTIVITAMIN CH
1.0000 | ORAL_TABLET | Freq: Every day | ORAL | Status: DC
  Administered 2020-08-09 – 2020-08-10 (×2): 1 via ORAL
  Filled 2020-08-09 (×2): qty 1

## 2020-08-09 MED ORDER — LACTATED RINGERS IV SOLN: INTRAVENOUS | Status: DC

## 2020-08-09 MED ORDER — FENTANYL-BUPIVACAINE-NACL 0.5-0.125-0.9 MG/250ML-% EP SOLN: 12.0000 mL/h | EPIDURAL | Status: DC | PRN

## 2020-08-09 MED ORDER — TETANUS-DIPHTH-ACELL PERTUSSIS 5-2.5-18.5 LF-MCG/0.5 IM SUSY: 0.5000 mL | PREFILLED_SYRINGE | Freq: Once | INTRAMUSCULAR | Status: DC

## 2020-08-09 MED ORDER — OXYCODONE HCL 5 MG PO TABS: 10.0000 mg | ORAL_TABLET | ORAL | Status: DC | PRN

## 2020-08-09 MED ORDER — WITCH HAZEL-GLYCERIN EX PADS: 1.0000 "application " | MEDICATED_PAD | CUTANEOUS | Status: DC | PRN

## 2020-08-09 MED ORDER — FENTANYL CITRATE (PF) 100 MCG/2ML IJ SOLN
100.0000 ug | Freq: Once | INTRAMUSCULAR | Status: DC
Start: 2020-08-09 — End: 2020-08-09

## 2020-08-09 MED ORDER — ZOLPIDEM TARTRATE 5 MG PO TABS: 5.0000 mg | ORAL_TABLET | Freq: Every evening | ORAL | Status: DC | PRN

## 2020-08-09 MED ORDER — OXYCODONE HCL 5 MG PO TABS: 5.0000 mg | ORAL_TABLET | ORAL | Status: DC | PRN

## 2020-08-09 MED ORDER — DIBUCAINE (PERIANAL) 1 % EX OINT: 1.0000 "application " | TOPICAL_OINTMENT | CUTANEOUS | Status: DC | PRN

## 2020-08-09 MED ORDER — DIPHENHYDRAMINE HCL 25 MG PO CAPS: 25.0000 mg | ORAL_CAPSULE | Freq: Four times a day (QID) | ORAL | Status: DC | PRN

## 2020-08-09 MED ORDER — ACETAMINOPHEN 325 MG PO TABS: 650.0000 mg | ORAL_TABLET | ORAL | Status: DC | PRN

## 2020-08-09 MED ORDER — SENNOSIDES-DOCUSATE SODIUM 8.6-50 MG PO TABS
2.0000 | ORAL_TABLET | Freq: Every day | ORAL | Status: DC
  Filled 2020-08-09: qty 2

## 2020-08-09 MED ORDER — COCONUT OIL OIL
1.0000 "application " | TOPICAL_OIL | Status: DC | PRN
  Administered 2020-08-09: 1 via TOPICAL

## 2020-08-09 MED ORDER — BENZOCAINE-MENTHOL 20-0.5 % EX AERO
1.0000 "application " | INHALATION_SPRAY | CUTANEOUS | Status: DC | PRN
  Administered 2020-08-09: 1 via TOPICAL
  Filled 2020-08-09 (×2): qty 56

## 2020-08-09 MED ORDER — OXYTOCIN-SODIUM CHLORIDE 30-0.9 UT/500ML-% IV SOLN: 2.5000 [IU]/h | INTRAVENOUS | Status: DC | PRN

## 2020-08-09 MED ORDER — LACTATED RINGERS AMNIOINFUSION: INTRAVENOUS | Status: DC

## 2020-08-09 MED ORDER — IBUPROFEN 600 MG PO TABS
600.0000 mg | ORAL_TABLET | Freq: Four times a day (QID) | ORAL | Status: DC
  Administered 2020-08-09 – 2020-08-11 (×8): 600 mg via ORAL
  Filled 2020-08-09 (×8): qty 1

## 2020-08-09 NOTE — Progress Notes (Signed)
Patient ID: Holly Rivas, female   DOB: 1985-06-24, 35 y.o.   MRN: [id number] Pt labored well to complete. Trial pushes started with good descent Will continue pushing till delivered Decels noted with pushing but good variability, 150, cat 2 Anticipate svd

## 2020-08-09 NOTE — Progress Notes (Signed)
Patient ID: Holly Rivas, female   DOB: August 30, 1985, 35 y.o.   MRN: [id number] Pt received an epidural but continued to have sharp shooting pain in her back. A redose was done and she felt better but shortly afterwards, her BP dropped to 70s/40s. Pt reported feeling nauseated and lightheaded. Decelerations were noted in FHR. Phenylephrine was administered with improvement in BP however FHR now had a wandering baseline; difficult to ascertain definatively IUPC was placed for better correlation of FSE with contractions  When FHR persistent as a cat 2, I had pitocin stopped Contractions at this time were q 2-48mins with pitocin at  Pt continued to have bouts of feeling lightheaded and dizzy with low BP I advised pt we will leave pitocin off for an hour then reassess pt and baby SVE was 6/80/-2 at this time   After an hour, baseline noted definitively at 140 with + accels, late decels by 10-15 with one big decel into the 90s for 2 mins  MVus at 80 with pitocin off SVE unchanged as expected   Position changes were employed and an amnioinfusion started  I advised pt and family of the option of an amnioinfusion and they agreed I explained that if fetal heart rate decelerations improved with amnioinfusion, then I would restart pitocin and continue to pursue option of svd  If however, fetal heart decelerations persisted, then I would advise on delivery per cesarean section  Incidentally, OR with only 1 scrub tech and case currently underway

## 2020-08-09 NOTE — Lactation Note (Signed)
This note was copied from a baby's chart. Lactation Consultation Note  Patient Name: Holly Rivas OEVOJ'J Date: 08/09/2020 Reason for consult: Initial assessment;Term;Primapara;1st time breastfeeding Age:33 hours   P1 mother whose infant is now 55 hours old.  This is a term baby at 39+3 weeks.   Baby was swaddled and asleep in the bassinet when I arrived.  Mother reported that he has latched and fed well x 2 since birth.  Mother feels a gentle tug and denies pain with latching.  Taught hand expression and she was able to express colostrum drops.  Discussed using colostrum and coconut oil for nipple comfort.  Provided coconut oil with instructions for use.  Mother will continue to feed 8-12 times/24 hours or sooner if baby shows feeding cues.  Encouraged to call her RN/LC for latch assistance as needed.    Mom made aware of O/P services, breastfeeding support groups, community resources, and our phone # for post-discharge questions.  Mother has a DEBP for home use.  Father present.    Maternal Data Has patient been taught Hand Expression?: Yes Does the patient have breastfeeding experience prior to this delivery?: No  Feeding Mother's Current Feeding Choice: Breast Milk  LATCH Score                    Lactation Tools Discussed/Used    Interventions Interventions: Education  Discharge Pump: Personal  Consult Status Consult Status: Follow-up Date: 08/10/20 Follow-up type: In-patient    Ladasia Sircy R Jawaun Celmer 08/09/2020, 11:45 AM

## 2020-08-09 NOTE — Social Work (Signed)
MOB was referred for history of anxiety.   * Referral screened out by Clinical Social Worker because none of the following criteria appear to apply:  ~ History of anxiety/depression during this pregnancy, or of post-partum depression following prior delivery. ~ Diagnosis of anxiety and/or depression within last 3 years. CSW reviewed chart and notes diagnosis onset as a teen. OR * MOB's symptoms currently being treated with medication and/or therapy.  Please contact the Clinical Social Worker if needs arise, by MOB request, or if MOB scores greater than 9/yes to question 10 on Edinburgh Postpartum Depression Screen.  Jen Eppinger, LCSWA Clinical Social Work [facility]  [phone]  

## 2020-08-09 NOTE — Anesthesia Preprocedure Evaluation (Signed)
Anesthesia Evaluation  Patient identified by MRN, date of birth, ID band Patient awake    Reviewed: Allergy & Precautions, H&P , NPO status , Patient's Chart, lab work & pertinent test results, reviewed documented beta blocker date and time   Airway Mallampati: II  TM Distance: >3 FB Neck ROM: full    Dental no notable dental hx. (+) Teeth Intact, Dental Advisory Given   Pulmonary neg pulmonary ROS,    Pulmonary exam normal breath sounds clear to auscultation       Cardiovascular negative cardio ROS Normal cardiovascular exam Rhythm:regular Rate:Normal     Neuro/Psych negative neurological ROS  negative psych ROS   GI/Hepatic negative GI ROS, Neg liver ROS,   Endo/Other  diabetes, Gestational  Renal/GU negative Renal ROS  negative genitourinary   Musculoskeletal   Abdominal   Peds  Hematology negative hematology ROS (+)   Anesthesia Other Findings   Reproductive/Obstetrics (+) Pregnancy                             Anesthesia Physical Anesthesia Plan  ASA: 3  Anesthesia Plan: Epidural   Post-op Pain Management:    Induction:   PONV Risk Score and Plan:   Airway Management Planned: Natural Airway  Additional Equipment: None  Intra-op Plan:   Post-operative Plan:   Informed Consent: I have reviewed the patients History and Physical, chart, labs and discussed the procedure including the risks, benefits and alternatives for the proposed anesthesia with the patient or authorized representative who has indicated his/her understanding and acceptance.       Plan Discussed with: Anesthesiologist  Anesthesia Plan Comments:         Anesthesia Quick Evaluation

## 2020-08-09 NOTE — Anesthesia Procedure Notes (Signed)
Epidural Patient location during procedure: OB Start time: 08/09/2020 12:04 AM End time: 08/09/2020 12:06 AM  Staffing Anesthesiologist: Bethena Midget, MD Performed: resident/CRNA   Preanesthetic Checklist Completed: patient identified, IV checked, site marked, risks and benefits discussed, surgical consent, monitors and equipment checked, pre-op evaluation and timeout performed  Epidural Patient position: sitting Prep: DuraPrep and site prepped and draped Patient monitoring: continuous pulse ox and blood pressure Approach: midline Location: L3-L4 Injection technique: LOR air  Needle:  Needle type: Tuohy  Needle gauge: 17 G Needle length: 9 cm and 9 Needle insertion depth: 8 cm Catheter type: closed end flexible Catheter size: 19 Gauge Catheter at skin depth: 13 cm Test dose: negative  Assessment Events: blood not aspirated, injection not painful, no injection resistance, no paresthesia and negative IV test

## 2020-08-09 NOTE — Lactation Note (Addendum)
This note was copied from a baby's chart. Lactation Consultation Note  Patient Name: Boy Brylin Stopper [id number] Date: 08/09/2020 Reason for consult: L&D Initial assessment Age:33 hours  P1, Baby cueing.  Assisted with latching in cradle hold on L side.  Baby latched with ease. Mother states she would eventually like to pump and bottle feed.  Lactation will follow up on MBU.    Maternal Data Does the patient have breastfeeding experience prior to this delivery?: No  Feeding    LATCH Score Latch: Grasps breast easily, tongue down, lips flanged, rhythmical sucking.  Audible Swallowing: A few with stimulation  Type of Nipple: Everted at rest and after stimulation  Comfort (Breast/Nipple): Soft / non-tender  Hold (Positioning): Assistance needed to correctly position infant at breast and maintain latch.  LATCH Score: 8    Interventions Interventions: Breast feeding basics reviewed;Assisted with latch;Skin to skin;Education   Consult Status Consult Status: Follow-up Date: 08/09/20 Follow-up type: In-patient    Dahlia Byes [facility] 08/09/2020, 7:22 AM

## 2020-08-09 NOTE — Progress Notes (Signed)
Patient ID: Holly Rivas, female   DOB: 1985/03/29, 35 y.o.   MRN: [id number] Pt with no complaints Cat 1 strip noted after amnioinfusion thus pitocin re-initiated  Will augment per protocol till adequate mvus Cesarean section indications reviewed with pt and family

## 2020-08-09 NOTE — Anesthesia Postprocedure Evaluation (Signed)
Anesthesia Post Note  Patient: Holly Rivas  Procedure(s) Performed: AN AD HOC LABOR EPIDURAL     Patient location during evaluation: Mother Baby Anesthesia Type: Epidural Level of consciousness: awake and alert Pain management: pain level controlled Vital Signs Assessment: post-procedure vital signs reviewed and stable Respiratory status: spontaneous breathing, nonlabored ventilation and respiratory function stable Cardiovascular status: stable Postop Assessment: no headache, no backache and epidural receding Anesthetic complications: no   No notable events documented.  Last Vitals:  Vitals:   08/09/20 1413 08/09/20 1806  BP: 111/68 127/75  Pulse: 80 80  Resp: 18 18  Temp: 36.6 C 36.6 C  SpO2: 100% 98%    Last Pain:  Vitals:   08/09/20 1806  TempSrc: Tympanic  PainSc: 0-No pain                 Maddox Bratcher

## 2020-08-10 LAB — CBC
HCT: 30.4 % — ABNORMAL LOW (ref 36.0–46.0)
Hemoglobin: 10.1 g/dL — ABNORMAL LOW (ref 12.0–15.0)
MCH: 32.1 pg (ref 26.0–34.0)
MCHC: 33.2 g/dL (ref 30.0–36.0)
MCV: 96.5 fL (ref 80.0–100.0)
Platelets: 137 10*3/uL — ABNORMAL LOW (ref 150–400)
RBC: 3.15 MIL/uL — ABNORMAL LOW (ref 3.87–5.11)
RDW: 14.1 % (ref 11.5–15.5)
WBC: 10.5 10*3/uL (ref 4.0–10.5)
nRBC: 0 % (ref 0.0–0.2)

## 2020-08-10 LAB — BIRTH TISSUE RECOVERY COLLECTION (PLACENTA DONATION)

## 2020-08-10 MED ORDER — RHO D IMMUNE GLOBULIN 1500 UNIT/2ML IJ SOSY
300.0000 ug | PREFILLED_SYRINGE | Freq: Once | INTRAMUSCULAR | Status: AC
  Administered 2020-08-10: 300 ug via INTRAMUSCULAR
  Filled 2020-08-10: qty 2

## 2020-08-10 NOTE — Lactation Note (Signed)
This note was copied from a baby's chart. Lactation Consultation Note  Patient Name: Holly Rivas [id number] Date: 08/10/2020 Reason for consult: Follow-up assessment Age:33 hours   LC Follow Up Visit:  Attempted to visit with mother, however, she is asleep at this time.    Maternal Data    Feeding    LATCH Score                    Lactation Tools Discussed/Used    Interventions    Discharge    Consult Status Consult Status: Follow-up Date: 08/10/20 Follow-up type: In-patient    [name] 08/10/2020, 10:57 AM

## 2020-08-10 NOTE — Lactation Note (Signed)
This note was copied from a baby's chart. Lactation Consultation Note  Patient Name: Holly Rivas Date: 08/10/2020 Reason for consult: Follow-up assessment;1st time breastfeeding;Primapara;Term Age:33 hours  3% weight loss with good output.  LC in to visit with P1 Mom.  Mom started donor breast milk supplementation due to "not having any milk yet".  Mom then stated she wanted to pump and bottle feed.   Baby cueing vigorously in crib, Mom had been sleeping.  Recommended STS and feeding baby.  Offered to help with positioning and latching to the breast.  Baby positioned in football hold on left breast and latched easily.  Baby remained with nutritive sucking and swallowing for >10 mins.  Encouraged Mom to offer breast with cues.  Goal of 8-12 feedings per 24 hrs.    If baby is supplemented per Mom's desire, Mom will double pump for 15 mins on initiation setting.    Recommended focusing more on latching baby to the breast to establish a full milk supply.  Encouraged Mom to ask for help prn.   Feeding Nipple Type: Extra Slow Flow  LATCH Score Latch: Grasps breast easily, tongue down, lips flanged, rhythmical sucking.  Audible Swallowing: Spontaneous and intermittent  Type of Nipple: Everted at rest and after stimulation  Comfort (Breast/Nipple): Soft / non-tender  Hold (Positioning): Assistance needed to correctly position infant at breast and maintain latch.  LATCH Score: 9   Lactation Tools Discussed/Used Tools: Pump Breast pump type: Double-Electric Breast Pump Reason for Pumping: Support milk supply/Mom's request Pumping frequency: when baby is supplemented  Interventions Interventions: Assisted with latch;Skin to skin;Breast massage;Hand express;Adjust position;Support pillows;Position options;DEBP   Consult Status Consult Status: Follow-up Date: 08/11/20 Follow-up type: In-patient    Holly Rivas 08/10/2020, 5:25 PM

## 2020-08-10 NOTE — Progress Notes (Signed)
Post Partum Day 1 Subjective: no complaints, up ad lib, voiding, tolerating PO, + flatus, and lochia mild. Baby working on breastfeeding otherwise no complaints. Denies HA, SOB or CP. Desires circumcision for baby  Objective: Blood pressure 114/70, pulse 66, temperature 97.8 F (36.6 C), temperature source Oral, resp. rate 16, height 5\' 7"  (1.702 m), weight 110.3 kg, SpO2 99 %, unknown if currently breastfeeding.  Physical Exam:  General: alert, cooperative, and no distress Lochia: appropriate Uterine Fundus: firm Incision: n/a DVT Evaluation: No evidence of DVT seen on physical exam. Calf/Ankle edema is present.  Recent Labs    08/08/20 0033 08/10/20 0619  HGB 12.2 10.1*  HCT 36.5 30.4*    Assessment/Plan: Plan for discharge tomorrow, Breastfeeding, Lactation consult, and Circumcision prior to discharge   LOS: 2 days   Lacee Grey W Machelle Raybon 08/10/2020, 8:44 AM

## 2020-08-11 ENCOUNTER — Ambulatory Visit: Payer: Self-pay

## 2020-08-11 LAB — RH IG WORKUP (INCLUDES ABO/RH)
Fetal Screen: NEGATIVE
Gestational Age(Wks): 39.3
Unit division: 0

## 2020-08-11 MED ORDER — IBUPROFEN 600 MG PO TABS: 600.0000 mg | ORAL_TABLET | Freq: Four times a day (QID) | ORAL | 0 refills | Status: DC

## 2020-08-11 NOTE — Lactation Note (Signed)
This note was copied from a baby's chart. Lactation Consultation Note  Patient Name: Boy Elease Swarm [id number] Date: 08/11/2020   Age:33 Hours  Mother pumping when I arrived to the room. Father giving infant a bottle of DBM mixed with small amt of ebm. Mother pumped an additional amt of ebm.  Discussed milk coming to volume. Mother reports that infant can sustain latch for about 10 mins. She then supplements intant with ebm /dbm. Discussed pre-pumping breast to soften areola for several mins. Hand pump put together and used a #27 flange that seemed too small. Mother fit with a #30 for the hand pump. She has been using #27 for the Symphony. Mother has a Research scientist (medical) at home.  I told her that I was unsure of how the flanges were sized.   Advised in treatment an prevention of engorgement.  MPlan of Care : Breastfeed infant with feeding cues Supplement infant with ebm/formula, according to supplemental guidelines. Pump using a DEBP after each feeding for 15-20 mins.   Mother to continue to cue base feed infant and feed at least 8-12 times or more in 24 hours and advised to allow for cluster feeding infant as needed.  Mother to continue to due STS. Mother is aware of available LC services at [facility], BFSG'S, OP Dept, and phone # for questions or concerns about breastfeeding.  Mother receptive to all teaching and plan of care.   Mother will get a call to schedule an OP LC appt follow visit.   Maternal Data    Feeding    LATCH Score                    Lactation Tools Discussed/Used    Interventions    Discharge    Consult Status      Michel Bickers 08/11/2020, 3:28 PM

## 2020-08-11 NOTE — Progress Notes (Signed)
PPD #2 Doing well Afeb, VSS D/c home 

## 2020-08-11 NOTE — Discharge Instructions (Signed)
As per discharge pamphlet °

## 2020-08-11 NOTE — Discharge Summary (Signed)
Postpartum Discharge Summary      Patient Name: Holly Rivas DOB: Feb 13, 1986 MRN: [id number]  Date of admission: 08/08/2020 Delivery date:08/09/2020  Delivering provider: Pryor Ochoa [facility]  Date of discharge: 08/11/2020  Admitting diagnosis: GDM (gestational diabetes mellitus) [O24.419] Intrauterine pregnancy: [redacted]w[redacted]d     Secondary diagnosis:  Active Problems:   GDM (gestational diabetes mellitus)   SVD (spontaneous vaginal delivery)    Discharge diagnosis: Term Pregnancy Delivered and GDM A2                                               Hospital course: Induction of Labor With Vaginal Delivery   35 y.o. yo G1P1001 at [redacted]w[redacted]d was admitted to the hospital 08/08/2020 for induction of labor.  Indication for induction: A2 DM.  Patient had an uncomplicated labor course as follows: Membrane Rupture Time/Date: 11:24 PM ,08/08/2020   Delivery Method:Vaginal, Spontaneous  Episiotomy: None  Lacerations:  2nd degree;Vaginal  Details of delivery can be found in separate delivery note.  Patient had a routine postpartum course. Patient is discharged home 08/11/20.  Newborn Data: Birth date:08/09/2020  Birth time:6:39 AM  Gender:Female  Living status:Living  Apgars:9 ,9  [id number] g    Physical exam  Vitals:   08/10/20 0643 08/10/20 1334 08/10/20 2121 08/11/20 0359  BP: 114/70 125/87 120/85 119/74  Pulse: 66 74 76 82  Resp: 16 18 18 16   Temp: 97.8 F (36.6 C) 98.2 F (36.8 C) 98.1 F (36.7 C) 98.2 F (36.8 C)  TempSrc: Oral Oral Oral Oral  SpO2: 99% 99% 100% 99%  Weight:      Height:       General: alert Lochia: appropriate Uterine Fundus: firm  Labs: Lab Results  Component Value Date   WBC 10.5 08/10/2020   HGB 10.1 (L) 08/10/2020   HCT 30.4 (L) 08/10/2020   MCV 96.5 08/10/2020   PLT 137 (L) 08/10/2020   No flowsheet data found. Edinburgh Score: Edinburgh Postnatal Depression Scale Screening Tool 08/10/2020  I have been able to laugh and see the funny side of  things. 0  I have looked forward with enjoyment to things. 0  I have blamed myself unnecessarily when things went wrong. 1  I have been anxious or worried for no good reason. 1  I have felt scared or panicky for no good reason. 1  Things have been getting on top of me. 1  I have been so unhappy that I have had difficulty sleeping. 0  I have felt sad or miserable. 0  I have been so unhappy that I have been crying. 0  The thought of harming myself has occurred to me. 0  Edinburgh Postnatal Depression Scale Total 4      After visit meds:  Allergies as of 08/11/2020   No Known Allergies      Medication List     TAKE these medications    ibuprofen 600 MG tablet Commonly known as: ADVIL Take 1 tablet (600 mg total) by mouth every 6 (six) hours.         Discharge home in stable condition Infant Feeding: Breast Infant Disposition:home with mother Discharge instruction: per After Visit Summary and Postpartum booklet. Activity: Advance as tolerated. Pelvic rest for 6 weeks.  Diet: routine diet Postpartum Appointment:6 weeks Follow up Visit:  Follow-up Information     08/13/2020  Worema, DO. Schedule an appointment as soon as possible for a visit in 6 week(s).   Specialty: Obstetrics and Gynecology Contact information: [address] [place] [address] [phone]                     08/11/2020 Zenaida Niece, MD

## 2020-08-19 ENCOUNTER — Telehealth (HOSPITAL_COMMUNITY): Payer: Self-pay | Admitting: *Deleted

## 2020-08-19 NOTE — Telephone Encounter (Signed)
Left message to return nurse call. Duffy Rhody, RN 08/19/2020 at 2:16pm

## 2020-12-20 ENCOUNTER — Encounter: Payer: Self-pay | Admitting: Nurse Practitioner

## 2020-12-20 NOTE — Progress Notes (Signed)
Patient scheduled appointment for her husband under her name. Had to change apppointment.

## 2022-02-14 ENCOUNTER — Ambulatory Visit: Payer: Self-pay | Admitting: Medical-Surgical

## 2022-02-15 ENCOUNTER — Ambulatory Visit (INDEPENDENT_AMBULATORY_CARE_PROVIDER_SITE_OTHER): Payer: Self-pay | Admitting: Family Medicine

## 2022-02-15 ENCOUNTER — Encounter: Payer: Self-pay | Admitting: Family Medicine

## 2022-02-15 VITALS — BP 124/76 | HR 92 | Temp 98.0°F | Ht 67.0 in | Wt 211.2 lb

## 2022-02-15 DIAGNOSIS — L509 Urticaria, unspecified: Secondary | ICD-10-CM

## 2022-02-15 DIAGNOSIS — K644 Residual hemorrhoidal skin tags: Secondary | ICD-10-CM

## 2022-02-15 DIAGNOSIS — K219 Gastro-esophageal reflux disease without esophagitis: Secondary | ICD-10-CM

## 2022-02-15 DIAGNOSIS — Z7689 Persons encountering health services in other specified circumstances: Secondary | ICD-10-CM

## 2022-02-15 DIAGNOSIS — Z713 Dietary counseling and surveillance: Secondary | ICD-10-CM

## 2022-02-15 MED ORDER — PANTOPRAZOLE SODIUM 40 MG PO TBEC: 40.0000 mg | DELAYED_RELEASE_TABLET | Freq: Every day | ORAL | 1 refills | Status: DC

## 2022-02-15 MED ORDER — MONTELUKAST SODIUM 10 MG PO TABS: 10.0000 mg | ORAL_TABLET | Freq: Every day | ORAL | 3 refills | Status: DC

## 2022-02-15 NOTE — Progress Notes (Signed)
New Patient Office Visit  Subjective    Patient ID: Holly Rivas, female    DOB: 30-Mar-1985  Age: 37 y.o. MRN: [id number]  CC:  Chief Complaint  Patient presents with   New Patient (Initial Visit)    Patient in office  to est PCP -rash on chest - has appearance of hives - feels is related to dairy intake- x one year- also c/o  either hemorrhoid or fissure, - given cream but this never worked on the area- site is large but not painful- occasional bleeding with constipation - also wanting to know if and get acid reflux med filled by out office - also wanting to ask about options for weight loss==kph    HPI Holly Rivas presents to establish care. She hasn't seen anyone since the birth of her son who is 1.5 y.o.   She has been taking Protonix prn for GERD. She has been on this since she was pregnant.  Pt reports a rash that comes on randomly on her chest. She reports it gets worse after eating diary such as cottage cheese, queso, and milk. She says this has started before her son. She says it didn't happen very often while she was pregnant with her son. She hasn't tried anything for the rash. She denies throat itching, diarrhea, abdominal pain, wheezing or rash anywhere on her body other than her chest.   Pt reports she had a fissure diagnosed when she was pregnant. She was given hemorrhoidal cream to use by her OB. This never cleared up. She says it's large but hasn't changed in size. There is no pain with bowel movements or wiping. She reports sometimes she has constipated and may have blood at that time but not normally.   Pt also would like options to lose weight. She has tried for the last year to lose weight. She feels like she is stagnant. Her preferred weight is 170. Pt reports she still eats bread but trying to do low calorie or whole wheat bread. She says her husband is Arabic and every meal has rice. She says the last month and a half no exercise. She was going 3-4 times a week to the  gym including light weight and 45 mins on the treadmill/incline and fast past.    Outpatient Encounter Medications as of 02/15/2022  Medication Sig   montelukast (SINGULAIR) 10 MG tablet Take 1 tablet (10 mg total) by mouth at bedtime.   [DISCONTINUED] pantoprazole (PROTONIX) 40 MG tablet Take 40 mg by mouth daily.   pantoprazole (PROTONIX) 40 MG tablet Take 1 tablet (40 mg total) by mouth daily.   [DISCONTINUED] ibuprofen (ADVIL) 600 MG tablet Take 1 tablet (600 mg total) by mouth every 6 (six) hours. (Patient not taking: Reported on 02/15/2022)   [DISCONTINUED] Prenatal Vit-Fe Fumarate-FA (PRENATAL MULTIVITAMIN) TABS tablet Take 1 tablet by mouth daily at 12 noon. (Patient not taking: Reported on 02/15/2022)   No facility-administered encounter medications on file as of 02/15/2022.    Past Medical History:  Diagnosis Date   GERD (gastroesophageal reflux disease)     History reviewed. No pertinent surgical history.  Family History  Problem Relation Age of Onset   Fibromyalgia Mother    COPD Father    Anxiety disorder Father    Depression Father    Breast cancer Maternal Aunt    Tongue cancer Paternal Uncle    Diabetes Maternal Grandmother    Heart attack Maternal Grandfather    Diabetes Paternal Grandmother  Pancreatic cancer Paternal Grandfather     Social History   Socioeconomic History   Marital status: Married    Spouse name: baha   Number of children: 1   Years of education: Not on file   Highest education level: Not on file  Occupational History   Not on file  Tobacco Use   Smoking status: Never   Smokeless tobacco: Never  Vaping Use   Vaping Use: Former   Substances: Nicotine  Substance and Sexual Activity   Alcohol use: Not Currently    Comment: very rareky   Drug use: Never   Sexual activity: Yes    Birth control/protection: Condom  Other Topics Concern   Not on file  Social History Narrative   Not on file   Social Determinants of Health   Financial  Resource Strain: Not on file  Food Insecurity: Not on file  Transportation Needs: Not on file  Physical Activity: Not on file  Stress: Not on file  Social Connections: Not on file  Intimate Partner Violence: Not on file    Review of Systems  Constitutional:  Negative for fever.  HENT:  Negative for congestion and sore throat.   Eyes:  Negative for blurred vision and double vision.  Respiratory:  Negative for cough, sputum production and shortness of breath.   Cardiovascular:  Negative for chest pain and palpitations.  Gastrointestinal:  Positive for heartburn. Negative for abdominal pain, diarrhea and vomiting.  Genitourinary:  Negative for dysuria and urgency.  Skin:  Positive for rash.  Neurological:  Negative for dizziness and headaches.  Psychiatric/Behavioral:  Negative for depression and suicidal ideas.   All other systems reviewed and are negative.       Objective    BP 124/76   Pulse 92   Temp 98 F (36.7 C)   Ht 5\' 7"  (1.702 m)   Wt 211 lb 4 oz (95.8 kg)   SpO2 98%   BMI 33.09 kg/m   Physical Exam Vitals and nursing note reviewed.  Constitutional:      Appearance: Normal appearance. She is normal weight.  HENT:     Head: Normocephalic and atraumatic.     Right Ear: External ear normal.     Left Ear: External ear normal.     Nose: Nose normal.     Mouth/Throat:     Mouth: Mucous membranes are moist.     Pharynx: Oropharynx is clear.  Eyes:     Conjunctiva/sclera: Conjunctivae normal.     Pupils: Pupils are equal, round, and reactive to light.  Cardiovascular:     Rate and Rhythm: Normal rate and regular rhythm.     Pulses: Normal pulses.     Heart sounds: Normal heart sounds.  Pulmonary:     Effort: Pulmonary effort is normal.     Breath sounds: Normal breath sounds.  Abdominal:     General: Abdomen is flat.  Genitourinary:    Comments: Anal skin tag present Skin:    General: Skin is warm.     Capillary Refill: Capillary refill takes less than  2 seconds.  Neurological:     General: No focal deficit present.     Mental Status: She is alert and oriented to person, place, and time. Mental status is at baseline.  Psychiatric:        Mood and Affect: Mood normal.        Behavior: Behavior normal.        Thought Content: Thought content normal.  Judgment: Judgment normal.        Assessment & Plan:   Problem List Items Addressed This Visit   None Visit Diagnoses     Encounter to establish care with new doctor    -  Primary   Gastroesophageal reflux disease without esophagitis       Relevant Medications   pantoprazole (PROTONIX) 40 MG tablet   Hives       Relevant Medications   montelukast (SINGULAIR) 10 MG tablet   Weight loss counseling, encounter for       Anal skin tag       Relevant Orders   Ambulatory referral to General Surgery      Refilled protonix to use prn for GERD For hives and pt reports relation to dairy, cut back on dairy and also keep food diary. Will also add trial of Singulair at bedtime Counseled on exercise and diet. Try to cut back on starch/carbohydrate and increase water intake. Add aerobic exercise at least 5 days a week, 30 minutes a day and follow up for annual exam for fasting labs Refer to general surgery for anal skin tag evaluation   Return in about 6 weeks (around 03/29/2022) for Annual Physical.   Suzan Slick, MD

## 2022-02-19 ENCOUNTER — Telehealth: Payer: Self-pay | Admitting: Family Medicine

## 2022-02-19 NOTE — Telephone Encounter (Signed)
noted 

## 2022-02-19 NOTE — Telephone Encounter (Signed)
Noted, this is a mistake when referring. Can send to [place] [facility]. DO I need to remove this referral and place a new one?

## 2022-02-19 NOTE — Telephone Encounter (Signed)
Indicated location to send referral states General Surgery but provider listed is a Pediatric Surgeon Specialist. Faythe Ghee to send to [facility], a [facility] facility located in [place]? Please advise. Buel Ream

## 2022-02-19 NOTE — Telephone Encounter (Signed)
No ma'am, a new referral is not required. Will change location for this referral and send as indicated. Buel Ream

## 2022-03-29 ENCOUNTER — Encounter: Payer: Self-pay | Admitting: Family Medicine

## 2022-04-18 ENCOUNTER — Encounter: Payer: Self-pay | Admitting: Family Medicine

## 2022-04-18 ENCOUNTER — Ambulatory Visit (INDEPENDENT_AMBULATORY_CARE_PROVIDER_SITE_OTHER): Payer: 59 | Admitting: Family Medicine

## 2022-04-18 VITALS — BP 126/86 | HR 78 | Temp 98.0°F | Ht 67.0 in | Wt 215.7 lb

## 2022-04-18 DIAGNOSIS — R7302 Impaired glucose tolerance (oral): Secondary | ICD-10-CM

## 2022-04-18 DIAGNOSIS — Z0000 Encounter for general adult medical examination without abnormal findings: Secondary | ICD-10-CM

## 2022-04-18 DIAGNOSIS — R002 Palpitations: Secondary | ICD-10-CM | POA: Diagnosis not present

## 2022-04-18 DIAGNOSIS — Z13220 Encounter for screening for lipoid disorders: Secondary | ICD-10-CM

## 2022-04-18 DIAGNOSIS — Z658 Other specified problems related to psychosocial circumstances: Secondary | ICD-10-CM

## 2022-04-18 MED ORDER — HYDROXYZINE HCL 10 MG PO TABS: 10.0000 mg | ORAL_TABLET | Freq: Three times a day (TID) | ORAL | 0 refills | Status: DC | PRN

## 2022-04-18 NOTE — Progress Notes (Signed)
Complete physical exam  Patient: Holly Rivas   DOB: [birth year]   37 y.o. Female  MRN: [id number]  Subjective:    Chief Complaint  Patient presents with   Annual Exam    Last PAP 2022 per pt. Pt is fasting.      Holly Rivas is a 37 y.o. female who presents today for a complete physical exam. She reports consuming a general diet. The patient does not participate in regular exercise at present. She generally feels well. She reports sleeping well. She does not have additional problems to discuss today.   Pt reports husband is going to [place] soon and she has anxiety episodes at night. She reports palpitations during these episodes.  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   Acute tonsillitis 07/19/2015   Low back pain 05/16/2010   GERD 05/19/2009   HYPERHIDROSIS 05/19/2009   Past Medical History:  Diagnosis Date   GERD (gastroesophageal reflux disease)    Past Surgical History:  Procedure Laterality Date   NO PAST SURGERIES     Social History   Tobacco Use   Smoking status: Never   Smokeless tobacco: Never  Vaping Use   Vaping Use: Former   Substances: Nicotine  Substance Use Topics   Alcohol use: Not Currently    Comment: very rareky   Drug use: Never   Family History  Problem Relation Age of Onset   Fibromyalgia Mother    COPD Father    Anxiety disorder Father    Depression Father    Breast cancer Maternal Aunt    Tongue cancer Paternal Uncle    Diabetes Maternal Grandmother    Heart attack Maternal Grandfather    Diabetes Paternal  Grandmother    Pancreatic cancer Paternal Grandfather    Heart failure Father    Thyroid disease Father       Patient Care Team: Leeanne Rio, MD as PCP - General (Family Medicine) Paula Compton, MD as PCP - OBGYN (Obstetrics and Gynecology) Hali Marry, MD   Outpatient Medications Prior to Visit  Medication Sig   pantoprazole (PROTONIX) 40 MG tablet Take 1 tablet (40 mg total) by mouth daily.   albuterol (PROVENTIL HFA;VENTOLIN HFA) 108 (90 Base) MCG/ACT inhaler Inhale 2 puffs into the lungs every 6 (six) hours as needed for wheezing or shortness of breath. (Patient not taking: Reported on 04/18/2022)   montelukast (SINGULAIR) 10 MG tablet Take 1 tablet (10 mg total) by mouth at bedtime. (Patient not taking: Reported on 04/18/2022)   valACYclovir (VALTREX) 1000 MG tablet Take 2 tablets (2,000 mg total) by mouth 2 (two) times daily. (Patient not taking: Reported on 04/18/2022)   [DISCONTINUED] metroNIDAZOLE (FLAGYL) 500 MG tablet Take 1 tablet (500 mg total) by mouth 2 (two) times daily.   No facility-administered medications prior to visit.    Review of Systems  Cardiovascular:  Positive for palpitations.  Psychiatric/Behavioral:  The patient is nervous/anxious.  All other systems reviewed and are negative.         Objective:     BP 126/86   Pulse 78   Temp 98 F (36.7 C) (Oral)   Ht '5\' 7"'$  (1.702 m)   Wt 215 lb 11.2 oz (97.8 kg)   LMP 03/28/2022 (Exact Date)   SpO2 99%   Breastfeeding No   BMI 33.78 kg/m  BP Readings from Last 3 Encounters:  04/18/22 126/86  02/15/22 124/76  08/11/20 119/74      Physical Exam Vitals and nursing note reviewed.  Constitutional:      Appearance: Normal appearance. She is normal weight.  HENT:     Head: Normocephalic and atraumatic.     Right Ear: Tympanic membrane, ear canal and external ear normal.     Left Ear: Tympanic membrane, ear canal and external ear normal.     Nose: Nose normal.     Mouth/Throat:      Mouth: Mucous membranes are moist.     Pharynx: Oropharynx is clear.  Eyes:     Conjunctiva/sclera: Conjunctivae normal.     Pupils: Pupils are equal, round, and reactive to light.  Cardiovascular:     Rate and Rhythm: Normal rate and regular rhythm.     Pulses: Normal pulses.     Heart sounds: Normal heart sounds.  Pulmonary:     Effort: Pulmonary effort is normal.     Breath sounds: Normal breath sounds.  Abdominal:     General: Abdomen is flat. Bowel sounds are normal.  Skin:    General: Skin is warm.     Capillary Refill: Capillary refill takes less than 2 seconds.  Neurological:     General: No focal deficit present.     Mental Status: She is alert and oriented to person, place, and time. Mental status is at baseline.  Psychiatric:        Mood and Affect: Mood normal.        Behavior: Behavior normal.        Thought Content: Thought content normal.        Judgment: Judgment normal.     No results found for any visits on 04/18/22.      Assessment & Plan:    Routine Health Maintenance and Physical Exam  Immunization History  Administered Date(s) Administered   Influenza Whole 02/21/2010   PFIZER(Purple Top)SARS-COV-2 Vaccination 09/12/2019   Tdap 02/20/2013    Health Maintenance  Topic Date Due   Hepatitis C Screening  Never done   PAP SMEAR-Modifier  05/12/2018   COVID-19 Vaccine (2 - Pfizer risk series) 05/04/2022 (Originally 10/03/2019)   INFLUENZA VACCINE  05/13/2022 (Originally 09/12/2021)   HIV Screening  Completed   HPV VACCINES  Aged Out   DTaP/Tdap/Td  Discontinued    Discussed health benefits of physical activity, and encouraged her to engage in regular exercise appropriate for her age and condition.  Annual physical exam  Encounter for lipid screening for cardiovascular disease -     Lipid panel  Impaired glucose tolerance -     CBC with Differential/Platelet -     Comprehensive metabolic panel -     Hemoglobin A1c  Palpitations -      TSH -     T4, free  Psychosocial stressors -     hydrOXYzine HCl; Take 1 tablet (10 mg total) by mouth 3 (three) times daily as needed for anxiety.  Dispense: 30 tablet; Refill: 0   Fasting labs including thyroid  function due to palpitations and increase anxiety. May be due to stressors as her husband is about to be deployed to [place]. Will add trial of Hydroxyzine '10mg'$  TID prn to try.    Return in about 1 year (around 04/18/2023) for Annual Physical.     Leeanne Rio, MD

## 2022-04-19 LAB — LIPID PANEL
Chol/HDL Ratio: 4.6 ratio — ABNORMAL HIGH (ref 0.0–4.4)
Cholesterol, Total: 209 mg/dL — ABNORMAL HIGH (ref 100–199)
HDL: 45 mg/dL (ref >39)
LDL Chol Calc (NIH): 147 mg/dL — ABNORMAL HIGH (ref 0–99)
Triglycerides: 92 mg/dL (ref 0–149)
VLDL Cholesterol Cal: 17 mg/dL (ref 5–40)

## 2022-04-19 LAB — COMPREHENSIVE METABOLIC PANEL
ALT: 13 IU/L (ref 0–32)
AST: 11 IU/L (ref 0–40)
Albumin/Globulin Ratio: 1.9 (ref 1.2–2.2)
Albumin: 4.5 g/dL (ref 3.9–4.9)
Alkaline Phosphatase: 71 IU/L (ref 44–121)
BUN/Creatinine Ratio: 17 (ref 9–23)
BUN: 11 mg/dL (ref 6–20)
Bilirubin Total: 0.3 mg/dL (ref 0.0–1.2)
CO2: 22 mmol/L (ref 20–29)
Calcium: 9.6 mg/dL (ref 8.7–10.2)
Chloride: 104 mmol/L (ref 96–106)
Creatinine, Ser: 0.64 mg/dL (ref 0.57–1.00)
Globulin, Total: 2.4 g/dL (ref 1.5–4.5)
Glucose: 92 mg/dL (ref 70–99)
Potassium: 4.9 mmol/L (ref 3.5–5.2)
Sodium: 139 mmol/L (ref 134–144)
Total Protein: 6.9 g/dL (ref 6.0–8.5)
eGFR: 119 mL/min/{1.73_m2} (ref >59)

## 2022-04-19 LAB — CBC WITH DIFFERENTIAL/PLATELET
Basophils Absolute: 0 10*3/uL (ref 0.0–0.2)
Basos: 1 %
EOS (ABSOLUTE): 0.1 10*3/uL (ref 0.0–0.4)
Eos: 1 %
Hematocrit: 39.9 % (ref 34.0–46.6)
Hemoglobin: 13.4 g/dL (ref 11.1–15.9)
Immature Grans (Abs): 0 10*3/uL (ref 0.0–0.1)
Immature Granulocytes: 1 %
Lymphocytes Absolute: 1.4 10*3/uL (ref 0.7–3.1)
Lymphs: 22 %
MCH: 30.6 pg (ref 26.6–33.0)
MCHC: 33.6 g/dL (ref 31.5–35.7)
MCV: 91 fL (ref 79–97)
Monocytes Absolute: 0.4 10*3/uL (ref 0.1–0.9)
Monocytes: 7 %
Neutrophils Absolute: 4.2 10*3/uL (ref 1.4–7.0)
Neutrophils: 68 %
Platelets: 224 10*3/uL (ref 150–450)
RBC: 4.38 x10E6/uL (ref 3.77–5.28)
RDW: 12 % (ref 11.7–15.4)
WBC: 6.1 10*3/uL (ref 3.4–10.8)

## 2022-04-19 LAB — HEMOGLOBIN A1C
Est. average glucose Bld gHb Est-mCnc: 114 mg/dL
Hgb A1c MFr Bld: 5.6 % (ref 4.8–5.6)

## 2022-04-19 LAB — T4, FREE: Free T4: 1.16 ng/dL (ref 0.82–1.77)

## 2022-04-19 LAB — TSH: TSH: 1.3 u[IU]/mL (ref 0.450–4.500)

## 2022-04-21 ENCOUNTER — Encounter: Payer: Self-pay | Admitting: Family Medicine

## 2022-04-24 ENCOUNTER — Other Ambulatory Visit: Payer: Self-pay | Admitting: Family Medicine

## 2022-04-24 DIAGNOSIS — Z658 Other specified problems related to psychosocial circumstances: Secondary | ICD-10-CM

## 2022-06-11 ENCOUNTER — Telehealth: Payer: Self-pay | Admitting: Family Medicine

## 2022-06-11 ENCOUNTER — Other Ambulatory Visit: Payer: Self-pay | Admitting: Family Medicine

## 2022-06-11 DIAGNOSIS — L509 Urticaria, unspecified: Secondary | ICD-10-CM

## 2022-06-11 NOTE — Telephone Encounter (Signed)
Patient is requesting to transfer care to Dr. Stephens Shire because her husband sees her as a patient please advise

## 2022-06-11 NOTE — Telephone Encounter (Signed)
Patient has be scheduled for 06-18-22 transfer of care to Dr. Tamera Punt

## 2022-06-17 NOTE — Progress Notes (Unsigned)
     Established patient visit   Patient: Holly Rivas   DOB: October 30, 1985   37 y.o. Female  MRN: [id number] Visit Date: 06/18/2022  Today's healthcare provider: Charlton Amor, DO   No chief complaint on file.   SUBJECTIVE   No chief complaint on file.  HPI    Review of Systems     No outpatient medications have been marked as taking for the 06/18/22 encounter (Appointment) with Charlton Amor, DO.    OBJECTIVE    There were no vitals taken for this visit.  Physical Exam   {Show previous labs (optional):23736}    ASSESSMENT & PLAN    Problem List Items Addressed This Visit   None   No follow-ups on file.      No orders of the defined types were placed in this encounter.   No orders of the defined types were placed in this encounter.    Charlton Amor, DO  [facility] at [facility] [phone] (phone) [phone] (fax)  [facility]

## 2022-06-18 ENCOUNTER — Encounter: Payer: Self-pay | Admitting: Family Medicine

## 2022-06-18 ENCOUNTER — Ambulatory Visit: Payer: 59 | Admitting: Family Medicine

## 2022-06-18 VITALS — BP 116/75 | HR 64 | Ht 67.0 in | Wt 216.2 lb

## 2022-06-18 DIAGNOSIS — F419 Anxiety disorder, unspecified: Secondary | ICD-10-CM

## 2022-06-18 DIAGNOSIS — L501 Idiopathic urticaria: Secondary | ICD-10-CM

## 2022-06-18 DIAGNOSIS — E669 Obesity, unspecified: Secondary | ICD-10-CM | POA: Diagnosis not present

## 2022-06-18 DIAGNOSIS — R635 Abnormal weight gain: Secondary | ICD-10-CM

## 2022-06-18 LAB — CBC WITH DIFFERENTIAL/PLATELET
Absolute Monocytes: 580 cells/uL (ref 200–950)
Basophils Absolute: 48 cells/uL (ref 0–200)
Basophils Relative: 0.7 %
Eosinophils Absolute: 179 cells/uL (ref 15–500)
Eosinophils Relative: 2.6 %
HCT: 38.5 % (ref 35.0–45.0)
Hemoglobin: 12.7 g/dL (ref 11.7–15.5)
Lymphs Abs: 1194 cells/uL (ref 850–3900)
MCH: 30.2 pg (ref 27.0–33.0)
MCHC: 33 g/dL (ref 32.0–36.0)
MCV: 91.7 fL (ref 80.0–100.0)
MPV: 12.2 fL (ref 7.5–12.5)
Monocytes Relative: 8.4 %
Neutro Abs: 4899 cells/uL (ref 1500–7800)
Neutrophils Relative %: 71 %
Platelets: 223 10*3/uL (ref 140–400)
RBC: 4.2 10*6/uL (ref 3.80–5.10)
RDW: 11.9 % (ref 11.0–15.0)
Total Lymphocyte: 17.3 %
WBC: 6.9 10*3/uL (ref 3.8–10.8)

## 2022-06-18 MED ORDER — CETIRIZINE HCL 10 MG PO TABS: 10.0000 mg | ORAL_TABLET | Freq: Every day | ORAL | 0 refills | Status: DC

## 2022-06-18 MED ORDER — PREDNISONE 20 MG PO TABS: 20.0000 mg | ORAL_TABLET | Freq: Every day | ORAL | 0 refills | Status: DC

## 2022-06-18 MED ORDER — WEGOVY 0.25 MG/0.5ML ~~LOC~~ SOAJ: 0.2500 mg | SUBCUTANEOUS | 0 refills | Status: DC

## 2022-06-18 NOTE — Assessment & Plan Note (Signed)
Patient notes a rash on her chest that will flare up randomly.  She says sometimes it happens when she gets anxious.  These flareups usually last about a week or so.  She is unsure what makes it better or makes it worse. - Will go ahead and do a trial for Zyrtec as well as a short course of steroids to see if we can knock down inflammation. - I will have patient follow-up in a month for efficacy.  If no better we will need to send to dermatology.

## 2022-06-18 NOTE — Assessment & Plan Note (Signed)
Patient does say her anxiety is worse.  However, she feels like she only has periods of anxiety that usually last about a week or so.  We did discuss having her think about therapy versus medication. - Will follow-up on this at her next visit as well.

## 2022-06-18 NOTE — Assessment & Plan Note (Signed)
Has noted difficulty gaining weight.  Her BMI is 33.  She has been exhausting all diet and exercise options.  This reason I do believe she would benefit from weight loss medication such as Wegovy.  Will go ahead and order.

## 2022-07-03 ENCOUNTER — Telehealth: Payer: Self-pay

## 2022-07-03 ENCOUNTER — Encounter (INDEPENDENT_AMBULATORY_CARE_PROVIDER_SITE_OTHER): Payer: 59 | Admitting: Family Medicine

## 2022-07-03 DIAGNOSIS — F411 Generalized anxiety disorder: Secondary | ICD-10-CM

## 2022-07-03 MED ORDER — ESCITALOPRAM OXALATE 10 MG PO TABS
10.0000 mg | ORAL_TABLET | Freq: Every day | ORAL | 1 refills | Status: DC
Start: 2022-07-03 — End: 2023-08-19

## 2022-07-03 NOTE — Telephone Encounter (Addendum)
Initiated Prior authorization ZOX:WRUEAV 0.25MG /0.5ML auto-injectors Via: Covermymeds Case/Key:[id number] Status: denied as of 07/03/22 Reason:We have received a request for [facility]. You are requesting this medication to use for weight loss. We reviewed the records received and your benefit plan coverage. Based on this information, the requested service is excluded from coverage under the health plan. This is not a covered benefit. Therefore, this request has not been approved. Please speak to your provider about your options for care. Reference: Jari Favre Certificate of Coverage, [place] 2024, On Exchange, [employer] Standard CSR 150/ Individual, Pharmacy Benefits, Limitations and Exclusions  Notified Pt via: Mychart

## 2022-07-03 NOTE — Telephone Encounter (Signed)
Please see the MyChart message reply(ies) for my assessment and plan.    This patient gave consent for this Medical Advice Message and is aware that it may result in a bill to [employer], as well as the possibility of receiving a bill for a co-payment or deductible. They are an established patient, but are not seeking medical advice exclusively about a problem treated during an in person or video visit in the last seven days. I did not recommend an in person or video visit within seven days of my reply.    I spent a total of 8 minutes cumulative time within 7 days through [employer].  Pt had anxiety at last visit, discussed adding anti-anxiety medication. Will go ahead and start lexapro 10mg   Pt will need follow up in 4-6 weeks for efficacy  Charlton Amor, DO

## 2022-07-16 ENCOUNTER — Ambulatory Visit: Payer: BLUE CROSS/BLUE SHIELD | Admitting: Family Medicine

## 2022-07-18 ENCOUNTER — Ambulatory Visit: Payer: 59 | Admitting: Family Medicine

## 2022-07-18 ENCOUNTER — Encounter: Payer: Self-pay | Admitting: Family Medicine

## 2022-07-18 VITALS — BP 119/83 | HR 77 | Ht 67.0 in | Wt 214.0 lb

## 2022-07-18 DIAGNOSIS — K649 Unspecified hemorrhoids: Secondary | ICD-10-CM | POA: Diagnosis not present

## 2022-07-18 DIAGNOSIS — L501 Idiopathic urticaria: Secondary | ICD-10-CM

## 2022-07-18 DIAGNOSIS — F419 Anxiety disorder, unspecified: Secondary | ICD-10-CM

## 2022-07-18 DIAGNOSIS — E669 Obesity, unspecified: Secondary | ICD-10-CM | POA: Diagnosis not present

## 2022-07-18 MED ORDER — TRIAMCINOLONE ACETONIDE 0.5 % EX CREA
1.0000 | TOPICAL_CREAM | Freq: Two times a day (BID) | CUTANEOUS | 3 refills | Status: DC
Start: 2022-07-18 — End: 2023-08-19

## 2022-07-18 MED ORDER — ZEPBOUND 2.5 MG/0.5ML ~~LOC~~ SOAJ
2.5000 mg | SUBCUTANEOUS | 0 refills | Status: DC
Start: 2022-07-18 — End: 2022-09-19

## 2022-07-18 MED ORDER — PHENYLEPHRINE-MINERAL OIL-PET 0.25-14-74.9 % RE OINT
1.0000 | TOPICAL_OINTMENT | Freq: Two times a day (BID) | RECTAL | 0 refills | Status: DC | PRN
Start: 2022-07-18 — End: 2023-08-19

## 2022-07-18 NOTE — Assessment & Plan Note (Addendum)
-   Patient has a strong family history of heart disease.  She also has a history of gestational diabetes that does put her more at risk for diabetes.  We will go ahead and orders at pounds to help with weight loss.  Her weight is at a BMI of 33 which makes this medication medically necessary for her overall health to prevent further health conditions.  This added weight does inhibit her life greatly.  It has caused her to suffer from anxiety and depression.  It has limited her ability to exercise.

## 2022-07-18 NOTE — Assessment & Plan Note (Signed)
Recommended patient continue Zyrtec.  Have also sent in a prescription of triamcinolone cream for her to try.  Will refer to allergy

## 2022-07-18 NOTE — Assessment & Plan Note (Signed)
Patient had questions about Lexapro that were answered today and she will go ahead and start this medication.  We discussed that when she starts it we will do a follow-up in about 4 to 6 weeks

## 2022-07-18 NOTE — Assessment & Plan Note (Signed)
On physical exam today.  We discussed with patient that if it is not hurting her or causing irritation we can go ahead and watch this for now.  I did send in Preparation H and encourage patient to increase fiber in her diet.  We also discussed being able to use sitz bath's to help decrease size.  If no better we can also send to colorectal surgery for removal.

## 2022-07-18 NOTE — Progress Notes (Signed)
Established patient visit   Patient: Holly Rivas   DOB: March 05, 1985   37 y.o. Female  MRN: [id number] Visit Date: 07/18/2022  Today's healthcare provider: Charlton Amor, DO   Chief Complaint  Patient presents with   Anxiety   Depression    Follow up on anxiety and depression.    SUBJECTIVE    Chief Complaint  Patient presents with   Anxiety   Depression    Follow up on anxiety and depression.   HPI  Anxiety  - pt is here to follow up. At last visit pt wanted to do therapy. She was started on lexapro but has not yet started it.   Obesity - pa denied for wegovy  Review of Systems  Constitutional:  Negative for activity change, fatigue and fever.  Respiratory:  Negative for cough and shortness of breath.   Cardiovascular:  Negative for chest pain.  Gastrointestinal:  Negative for abdominal pain.  Genitourinary:  Negative for difficulty urinating.       Current Meds  Medication Sig   cetirizine (ZYRTEC) 10 MG tablet Take 1 tablet (10 mg total) by mouth daily.   escitalopram (LEXAPRO) 10 MG tablet Take 1 tablet (10 mg total) by mouth daily.   hydrOXYzine (ATARAX) 10 MG tablet Take 1 tablet (10 mg total) by mouth 3 (three) times daily as needed for anxiety.   pantoprazole (PROTONIX) 40 MG tablet Take 1 tablet (40 mg total) by mouth daily.   phenylephrine-shark liver oil-mineral oil-petrolatum (PREPARATION H) 0.25-14-74.9 % rectal ointment Place 1 Application rectally 2 (two) times daily as needed for hemorrhoids.   predniSONE (DELTASONE) 20 MG tablet Take 1 tablet (20 mg total) by mouth daily with breakfast.   tirzepatide (ZEPBOUND) 2.5 MG/0.5ML Pen Inject 2.5 mg into the skin once a week.   triamcinolone cream (KENALOG) 0.5 % Apply 1 Application topically 2 (two) times daily. To affected areas.   [DISCONTINUED] WEGOVY 0.25 MG/0.5ML SOAJ Inject 0.25 mg into the skin once a week. Use this dose for 1 month (4 shots) and then increase to next higher dose.     OBJECTIVE    BP 119/83   Pulse 77   Ht 5\' 7"  (1.702 m)   Wt 214 lb (97.1 kg)   SpO2 100%   BMI 33.52 kg/m   Physical Exam Vitals and nursing note reviewed.  Constitutional:      General: She is not in acute distress.    Appearance: Normal appearance.  HENT:     Head: Normocephalic and atraumatic.     Right Ear: External ear normal.     Left Ear: External ear normal.     Nose: Nose normal.  Eyes:     Conjunctiva/sclera: Conjunctivae normal.  Cardiovascular:     Rate and Rhythm: Normal rate and regular rhythm.  Pulmonary:     Effort: Pulmonary effort is normal.     Breath sounds: Normal breath sounds.  Genitourinary:    Comments: Chaperone present during exam.  Large hemorrhoid present at the 4 to 6 o'clock position.  No redness or erythema present. Neurological:     General: No focal deficit present.     Mental Status: She is alert and oriented to person, place, and time.  Psychiatric:        Mood and Affect: Mood normal.        Behavior: Behavior normal.        Thought Content: Thought content normal.  Judgment: Judgment normal.       ASSESSMENT & PLAN    Problem List Items Addressed This Visit       Cardiovascular and Mediastinum   Hemorrhoids    On physical exam today.  We discussed with patient that if it is not hurting her or causing irritation we can go ahead and watch this for now.  I did send in Preparation H and encourage patient to increase fiber in her diet.  We also discussed being able to use sitz bath's to help decrease size.  If no better we can also send to colorectal surgery for removal.      Relevant Medications   phenylephrine-shark liver oil-mineral oil-petrolatum (PREPARATION H) 0.25-14-74.9 % rectal ointment     Musculoskeletal and Integument   Chronic idiopathic urticaria - Primary    Recommended patient continue Zyrtec.  Have also sent in a prescription of triamcinolone cream for her to try.  Will refer to allergy       Relevant Medications   triamcinolone cream (KENALOG) 0.5 %   Other Relevant Orders   Ambulatory referral to Allergy     Other   Obesity (BMI 30.0-34.9)    - Patient has a strong family history of heart disease.  She also has a history of gestational diabetes that does put her more at risk for diabetes.  We will go ahead and orders at pounds to help with weight loss.  Her weight is at a BMI of 33 which makes this medication medically necessary for her overall health to prevent further health conditions.  This added weight does inhibit her life greatly.  It has caused her to suffer from anxiety and depression.  It has limited her ability to exercise.      Relevant Medications   tirzepatide (ZEPBOUND) 2.5 MG/0.5ML Pen   Anxiety    Patient had questions about Lexapro that were answered today and she will go ahead and start this medication.  We discussed that when she starts it we will do a follow-up in about 4 to 6 weeks       Return in about 4 weeks (around 08/15/2022).      Meds ordered this encounter  Medications   triamcinolone cream (KENALOG) 0.5 %    Sig: Apply 1 Application topically 2 (two) times daily. To affected areas.    Dispense:  30 g    Refill:  3   tirzepatide (ZEPBOUND) 2.5 MG/0.5ML Pen    Sig: Inject 2.5 mg into the skin once a week.    Dispense:  2 mL    Refill:  0   phenylephrine-shark liver oil-mineral oil-petrolatum (PREPARATION H) 0.25-14-74.9 % rectal ointment    Sig: Place 1 Application rectally 2 (two) times daily as needed for hemorrhoids.    Dispense:  56 g    Refill:  0    Orders Placed This Encounter  Procedures   Ambulatory referral to Allergy    Referral Priority:   Routine    Referral Type:   Allergy Testing    Referral Reason:   Specialty Services Required    Requested Specialty:   Allergy    Number of Visits Requested:   1     Charlton Amor, DO  [facility] Primary Care & Sports Medicine at [facility] [phone]  (phone) [phone] (fax)  [facility]

## 2022-07-27 ENCOUNTER — Encounter: Payer: Self-pay | Admitting: Family Medicine

## 2022-07-28 ENCOUNTER — Telehealth: Payer: Self-pay

## 2022-07-28 NOTE — Telephone Encounter (Signed)
Initiated Prior authorization WGN:FAOZHYQM 2.5MG /0.5ML pen-injectors Via: Covermymeds Case/Key:[id number] Status: denied  as of 07/28/22 Reason:On 07/27/2022, we received a request from your prescriber for coverage of ZEPBOUND 2.5/0.5 PEN for you. This letter is to notify you that we have denied this request because: We have received a request for Zepbound. You are requesting this medication to use for weight loss. We reviewed the records received and your benefit plan coverage. Based on this information, the requested service is excluded from coverage under the health plan. This is not a covered benefit. Therefore, this request has not been approved. Please speak to your provider about your options for care. Notified Pt via: Mychart

## 2022-07-30 ENCOUNTER — Other Ambulatory Visit: Payer: Self-pay | Admitting: Family Medicine

## 2022-07-30 DIAGNOSIS — E669 Obesity, unspecified: Secondary | ICD-10-CM

## 2022-08-06 ENCOUNTER — Encounter (INDEPENDENT_AMBULATORY_CARE_PROVIDER_SITE_OTHER): Payer: Self-pay | Admitting: Internal Medicine

## 2022-08-24 ENCOUNTER — Ambulatory Visit (INDEPENDENT_AMBULATORY_CARE_PROVIDER_SITE_OTHER): Payer: 59 | Admitting: Sports Medicine

## 2022-08-24 ENCOUNTER — Ambulatory Visit (INDEPENDENT_AMBULATORY_CARE_PROVIDER_SITE_OTHER): Payer: 59

## 2022-08-24 DIAGNOSIS — M25572 Pain in left ankle and joints of left foot: Secondary | ICD-10-CM | POA: Diagnosis not present

## 2022-08-24 DIAGNOSIS — G8929 Other chronic pain: Secondary | ICD-10-CM | POA: Diagnosis not present

## 2022-08-24 NOTE — Progress Notes (Signed)
    Procedures performed today:    None.  Independent interpretation of notes and tests performed by another provider:   None.  Brief History, Exam, Impression, and Recommendations:    Left ankle pain This is a very pleasant 37 year old female, she had about 3 weeks of pain left ankle anterior tibiotalar as well as lateral sinus tarsi, worse with weightbearing, she did start some ibuprofen which seemed to help a lot. She also notes pain worsens when it rains. There has been no change in activity and no trauma. On exam she has reproduction of pain with resisted dorsiflexion, other exam findings are normal with the exception of tenderness lateral talar dome. Unclear etiology, she does tell with the pain refers to the big toe and up to the knee, back hurts but no numbness or tingling. I would like x-rays of her ankle, she will continue ibuprofen, adding ankle arthritis conditioning. I would like to see her back in about 4 weeks and if insufficient improvement we will proceed with MRI. We also need to consider a radicular source.    ____________________________________________ Ihor Austin. Benjamin Stain, M.D., ABFM., CAQSM., AME. Primary Care and Sports Medicine [facility]  Adjunct Professor of Family Medicine  [place] [employer] [facility] [employer]  Restaurant manager, fast food

## 2022-08-24 NOTE — Assessment & Plan Note (Signed)
This is a very pleasant 37 year old female, she had about 3 weeks of pain left ankle anterior tibiotalar as well as lateral sinus tarsi, worse with weightbearing, she did start some ibuprofen which seemed to help a lot. She also notes pain worsens when it rains. There has been no change in activity and no trauma. On exam she has reproduction of pain with resisted dorsiflexion, other exam findings are normal with the exception of tenderness lateral talar dome. Unclear etiology, she does tell with the pain refers to the big toe and up to the knee, back hurts but no numbness or tingling. I would like x-rays of her ankle, she will continue ibuprofen, adding ankle arthritis conditioning. I would like to see her back in about 4 weeks and if insufficient improvement we will proceed with MRI. We also need to consider a radicular source.

## 2022-08-29 ENCOUNTER — Encounter (INDEPENDENT_AMBULATORY_CARE_PROVIDER_SITE_OTHER): Payer: Self-pay | Admitting: Internal Medicine

## 2022-09-04 ENCOUNTER — Encounter: Payer: Self-pay | Admitting: Family Medicine

## 2022-09-06 ENCOUNTER — Other Ambulatory Visit: Payer: Self-pay | Admitting: Family Medicine

## 2022-09-06 ENCOUNTER — Ambulatory Visit: Payer: Self-pay | Admitting: Allergy

## 2022-09-06 MED ORDER — BUSPIRONE HCL 5 MG PO TABS: 5.0000 mg | ORAL_TABLET | Freq: Three times a day (TID) | ORAL | 0 refills | Status: DC | PRN

## 2022-09-18 NOTE — Progress Notes (Unsigned)
Office: [phone]  /  Fax: [phone]   TeleHealth Visit:  This visit was completed with telemedicine (audio/video) technology. [name] has verbally consented to this TeleHealth visit. The patient is located at home, the provider is located at home. The participants in this visit include the listed provider and patient. The visit was conducted today via MyChart video.  Initial Visit  Holly Rivas was seen via virtual visit today to evaluate for treatment of obesity. She is interested in losing weight to improve overall health and reduce the risk of weight related complications. She presents today to review program treatment options, initial physical assessment, and evaluation.     Height: 5\' 7"  Weight: 214 lbs BMI: 33  She was referred by: PCP  When asked what else they would like to accomplish? She states: {EMHopetoaccomplish:28304}  Weight history: ***  When asked how has your weight affected you? She states: {EMWeightAffected:28305}  Some associated conditions: Hyperlipidemia  Contributing factors: {EMcontributingfactors:28307}  Weight promoting medications identified: {EMWeightpromotingrx:28308}  Current nutrition plan: {EMNutritionplan:28309::"None"}  Current level of physical activity: {EMcurrentPA:28310::"None"}  Current or previous pharmacotherapy: {EM previousRx:28311}  Response to medication: {EMResponsetomedication:28312}  Past medical history includes:   Past Medical History:  Diagnosis Date   GERD (gastroesophageal reflux disease)      Objective:     General:  Alert, oriented and cooperative. Patient is in no acute distress.  Respiratory: Normal respiratory effort, no problems with respiration noted  Mental Status: Normal mood and affect. Normal behavior. Normal judgment and thought content.    Assessment and Plan:   1. Hyperlipidemia LDL is not at goal.  Lipid profile from 04/18/2022: LDL elevated at 147, HDL low at 45, triglycerides  normal. Medication(s): none  Lab Results  Component Value Date   CHOL 209 (H) 04/18/2022   HDL 45 04/18/2022   LDLCALC 147 (H) 04/18/2022   TRIG 92 04/18/2022   CHOLHDL 4.6 (H) 04/18/2022   CHOLHDL 3.6 05/12/2015   CHOLHDL 2.9 02/23/2013   Lab Results  Component Value Date   ALT 13 04/18/2022   AST 11 04/18/2022   ALKPHOS 71 04/18/2022   BILITOT 0.3 04/18/2022   The ASCVD Risk score (Arnett DK, et al., 2019) failed to calculate for the following reasons:   The 2019 ASCVD risk score is only valid for ages 36 to 79  Plan: Continue statin. Work on lifestyle interventions in conjunction with our program.  2. Obesity Treatment / Action Plan:  Will complete provided nutritional and psychosocial assessment questionnaire before the next appointment. Will be scheduled for indirect calorimetry to determine resting energy expenditure in a fasting state.  This will allow [name] to create a reduced calorie, high-protein meal plan to promote loss of fat mass while preserving muscle mass. Was counseled on pharmacotherapy and role as an adjunct in weight management.   Obesity Education Performed Today:  We discussed obesity as a disease and the importance of a more detailed evaluation of all the factors contributing to the disease.  We discussed the importance of long term lifestyle changes which include nutrition, exercise and behavioral modifications as well as the importance of customizing this to her specific health and social needs.  We discussed the benefits of reaching a healthier weight to alleviate the symptoms of existing conditions and reduce the risks of the biomechanical, metabolic and psychological effects of obesity.  Holly Rivas appears to be in the action stage of change and states they are ready to start intensive lifestyle modifications and behavioral modifications.  ______________________________________________________________________________  She will be contacted by  [facility] to set up initial appointment and the first follow up appointment with a physician.   The following office policies were discussed. She voiced understanding: - Patient will be considered late at 6 minutes past appointment time.   - For the first office visit, patient needs to arrive 1 hour early, fasting except for water. Patient should arrive 15 minutes early for all other visits. -  Patient will bring completed new patient paperwork to first office visit.  If not, appointment will be rescheduled.  30 minutes was spent today on this visit including the above counseling, pre-visit chart review, and post-visit documentation.  Reviewed by clinician on day of visit: allergies, medications, problem list, medical history, surgical history, family history, social history, and previous encounter notes pertinent to obesity diagnosis.    Jesse Sans, FNP

## 2022-09-19 ENCOUNTER — Encounter (INDEPENDENT_AMBULATORY_CARE_PROVIDER_SITE_OTHER): Payer: Self-pay | Admitting: Family Medicine

## 2022-09-19 ENCOUNTER — Telehealth (INDEPENDENT_AMBULATORY_CARE_PROVIDER_SITE_OTHER): Payer: 59 | Admitting: Family Medicine

## 2022-09-19 DIAGNOSIS — E785 Hyperlipidemia, unspecified: Secondary | ICD-10-CM | POA: Diagnosis not present

## 2022-09-19 DIAGNOSIS — Z6833 Body mass index (BMI) 33.0-33.9, adult: Secondary | ICD-10-CM

## 2022-09-19 DIAGNOSIS — E7849 Other hyperlipidemia: Secondary | ICD-10-CM

## 2022-09-19 DIAGNOSIS — E669 Obesity, unspecified: Secondary | ICD-10-CM | POA: Diagnosis not present

## 2022-09-21 ENCOUNTER — Ambulatory Visit: Payer: 59 | Admitting: Sports Medicine

## 2022-09-21 DIAGNOSIS — L02412 Cutaneous abscess of left axilla: Secondary | ICD-10-CM | POA: Diagnosis not present

## 2022-09-21 MED ORDER — HYDROCODONE-ACETAMINOPHEN 5-325 MG PO TABS: 1.0000 | ORAL_TABLET | Freq: Three times a day (TID) | ORAL | 0 refills | Status: DC | PRN

## 2022-09-21 MED ORDER — IBUPROFEN 800 MG PO TABS: 800.0000 mg | ORAL_TABLET | Freq: Three times a day (TID) | ORAL | 2 refills | Status: DC | PRN

## 2022-09-21 MED ORDER — DOXYCYCLINE HYCLATE 100 MG PO TABS: 100.0000 mg | ORAL_TABLET | Freq: Two times a day (BID) | ORAL | 0 refills | Status: DC

## 2022-09-21 NOTE — Assessment & Plan Note (Signed)
Pleasant 37 year old female, she has had a couple of days of increasing pain, swelling left axilla, on exam she does have what appears to be a large abscess with significant fluctuance. Today we did an I&D, packed, I did add a wound culture. Will do doxycycline, ibuprofen 800, hydrocodone. Return to see [name] in approximately a week.

## 2022-09-21 NOTE — Patient Instructions (Signed)
Incision and Drainage, Care After After incision and drainage, it is common to have: Pain or discomfort around the incision site. Blood, fluid, or pus (drainage) from the incision. Redness and firm skin around the incision site. Follow these instructions at home: Medicines Take over-the-counter and prescription medicines only as told by your health care provider. If you were prescribed antibiotics, take them as told by your provider. Do not stop using the antibiotic even if you start to feel better. Do not apply creams, ointments, or liquids unless you have been told to by your provider. Wound care Follow instructions from your provider about how to take care of your wound. Make sure you: Wash your hands with soap and water for at least 20 seconds before and after you change your bandage (dressing). If soap and water are not available, use hand sanitizer. Change your dressing and any packing as told by your provider. If the dressing is dry or stuck when you try to remove it, moisten or wet it with saline or water. This will help you remove it without harming your skin or tissues. If your wound is packed, leave it in place until your provider tells you to remove it. To remove it, moisten or wet the packing with saline or water. Leave stitches (sutures), skin glue, or tape strips in place. These skin closures may need to stay in place for 2 weeks or longer. If tape strip edges start to loosen and curl up, you may trim the loose edges. Do not remove tape strips completely unless your provider tells you to do that. Check your wound every day for signs of infection. Check for: More redness, swelling, or pain. More fluid or blood. Warmth. Pus or a bad smell. If you were sent home with a drain tube in place, follow instructions from your provider about: How to empty it. How to care for it at home. Be careful when you get rid of used dressings, wound packing, or drainage. Activity Rest the  affected area. Return to your normal activities as told by your provider. Ask your provider what activities are safe for you. General instructions Do not use any products that contain nicotine or tobacco. These products include cigarettes, chewing tobacco, and vaping devices, such as e-cigarettes. These can delay incision healing after surgery. If you need help quitting, ask your provider. Do not take baths, swim, or use a hot tub until your provider approves. Ask your provider if you may take showers. You may only be allowed to take sponge baths. The incision will keep draining. It is normal to have some clear or slightly bloody drainage. The amount of drainage should go down each day. Keep all follow-up visits. Your provider will need to make sure that your incision is healing well and that there are no problems. Your health care provider may give you more instructions. Make sure you know what you can and cannot do Contact a health care provider if: Your cyst or abscess comes back. You have any signs of infection. You notice red streaks that spread away from the incision site. You have a fever or chills. Get help right away if: You have severe pain or bleeding. You become short of breath. You have chest pain. You have signs of a severe infection. You may notice changes in your incision area, such as: Swelling that makes the skin feel hard. Numbness or tingling. Sudden increase in redness. Your skin color may change from red to purple, and then to dark  spots. Blisters, ulcers, or splitting of the skin. These symptoms may be an emergency. Get help right away. Call 911. Do not wait to see if the symptoms will go away. Do not drive yourself to the hospital. This information is not intended to replace advice given to you by your health care provider. Make sure you discuss any questions you have with your health care provider. Document Revised: 09/18/2021 Document Reviewed: 09/18/2021 Elsevier  Patient Education  2024 [employer].

## 2022-09-21 NOTE — Progress Notes (Signed)
    Procedures performed today:    Complicated Incision and drainage of left axilla abscess. Risks, benefits, and alternatives explained and consent obtained. Time out conducted. Surface cleaned with alcohol. 10cc lidocaine with epinephine infiltrated around abscess. Adequate anesthesia ensured. Area prepped and draped in a sterile fashion. #11 blade used to make a stab incision into abscess. Pus expressed with pressure. Curved hemostat used to explore 4 quadrants and loculations broken up. Further purulence expressed. Iodoform packing placed leaving a 1-inch tail. Hemostasis achieved. Pt stable. Aftercare and follow-up advised.  Independent interpretation of notes and tests performed by another provider:   None.  Brief History, Exam, Impression, and Recommendations:    Abscess of axilla, left Pleasant 37 year old female, she has had a couple of days of increasing pain, swelling left axilla, on exam she does have what appears to be a large abscess with significant fluctuance. Today we did an I&D, packed, I did add a wound culture. Will do doxycycline, ibuprofen 800, hydrocodone. Return to see [name] in approximately a week.    ____________________________________________ Ihor Austin. Benjamin Stain, M.D., ABFM., CAQSM., AME. Primary Care and Sports Medicine [facility] [facility]  Adjunct Professor of Family Medicine  [place] [employer] [facility] [employer]  Restaurant manager, fast food

## 2022-09-24 ENCOUNTER — Encounter: Payer: Self-pay | Admitting: Sports Medicine

## 2022-09-24 LAB — WOUND CULTURE

## 2022-09-28 ENCOUNTER — Ambulatory Visit (INDEPENDENT_AMBULATORY_CARE_PROVIDER_SITE_OTHER): Payer: 59 | Admitting: Sports Medicine

## 2022-09-28 ENCOUNTER — Telehealth: Payer: Self-pay | Admitting: Family Medicine

## 2022-09-28 DIAGNOSIS — L02412 Cutaneous abscess of left axilla: Secondary | ICD-10-CM

## 2022-09-28 MED ORDER — DOXYCYCLINE HYCLATE 100 MG PO TABS
100.0000 mg | ORAL_TABLET | Freq: Two times a day (BID) | ORAL | 0 refills | Status: AC
Start: 2022-09-28 — End: 2022-10-08

## 2022-09-28 MED ORDER — DOXYCYCLINE HYCLATE 100 MG PO TABS
100.0000 mg | ORAL_TABLET | Freq: Two times a day (BID) | ORAL | 0 refills | Status: DC
Start: 2022-09-28 — End: 2022-09-28

## 2022-09-28 NOTE — Telephone Encounter (Signed)
10 days, I updated the rx as well.

## 2022-09-28 NOTE — Assessment & Plan Note (Signed)
This pleasant 37 year old female returns, she is now approximately 7 days status post incision and drainage of left axillary abscess, it was packed, wound culture did grow out staph aureus sensitive to doxycycline, oxacillin, rifampin, clindamycin.  Resistant to Septra. Packing is out, doxycycline is finished, she is doing a lot better but still has significant fullness that feels somewhat deep to the area we incised and drained. I am going to restart antibiotics, we will use doxycycline again and do a 10-day course, she will continue dressings, warm compresses daily with a heating pad. I like to see her back in about a week and a half and if still having significant fullness we will CT with IV contrast to ensure were not missing a collection or mass.

## 2022-09-28 NOTE — Telephone Encounter (Signed)
[facility] pharmacy called in wanting clarification on RX quantity:  doxycycline (VIBRA-TABS) 100 MG tablet. Is the quantity needing to be 20 tablets or 14?   [facility] [phone]

## 2022-09-28 NOTE — Progress Notes (Signed)
    Procedures performed today:    None.  Independent interpretation of notes and tests performed by another provider:   None.  Brief History, Exam, Impression, and Recommendations:    Abscess of axilla, left This pleasant 37 year old female returns, she is now approximately 7 days status post incision and drainage of left axillary abscess, it was packed, wound culture did grow out staph aureus sensitive to doxycycline, oxacillin, rifampin, clindamycin.  Resistant to Septra. Packing is out, doxycycline is finished, she is doing a lot better but still has significant fullness that feels somewhat deep to the area we incised and drained. I am going to restart antibiotics, we will use doxycycline again and do a 10-day course, she will continue dressings, warm compresses daily with a heating pad. I like to see her back in about a week and a half and if still having significant fullness we will CT with IV contrast to ensure were not missing a collection or mass.    ____________________________________________ Ihor Austin. Benjamin Stain, M.D., ABFM., CAQSM., AME. Primary Care and Sports Medicine  [facility]  Adjunct Professor of Family Medicine  [place] [employer] [facility] [employer]  Restaurant manager, fast food

## 2022-10-10 ENCOUNTER — Ambulatory Visit: Payer: 59 | Admitting: Sports Medicine

## 2022-10-11 ENCOUNTER — Ambulatory Visit (INDEPENDENT_AMBULATORY_CARE_PROVIDER_SITE_OTHER): Payer: 59 | Admitting: Sports Medicine

## 2022-10-11 ENCOUNTER — Encounter: Payer: Self-pay | Admitting: Sports Medicine

## 2022-10-11 DIAGNOSIS — L02412 Cutaneous abscess of left axilla: Secondary | ICD-10-CM | POA: Diagnosis not present

## 2022-10-11 NOTE — Progress Notes (Signed)
    Procedures performed today:    None.  Independent interpretation of notes and tests performed by another provider:   None.  Brief History, Exam, Impression, and Recommendations:    Abscess of axilla, left Doing extremely well now, she is now approximately 2 weeks status post I&D left axillary abscess, Staphylococcus aureus sensitive to doxycycline, we had to do a second course of doxycycline after removing the packing, she returns today with complete resolution and no further fullness.    ____________________________________________ Ihor Austin. Benjamin Stain, M.D., ABFM., CAQSM., AME. Primary Care and Sports Medicine [place] [facility]  Adjunct Professor of Family Medicine  [place] [facility] [employer]  Restaurant manager, fast food

## 2022-10-11 NOTE — Assessment & Plan Note (Signed)
Doing extremely well now, she is now approximately 2 weeks status post I&D left axillary abscess, Staphylococcus aureus sensitive to doxycycline, we had to do a second course of doxycycline after removing the packing, she returns today with complete resolution and no further fullness.

## 2022-11-30 ENCOUNTER — Other Ambulatory Visit (HOSPITAL_BASED_OUTPATIENT_CLINIC_OR_DEPARTMENT_OTHER): Payer: Self-pay

## 2022-11-30 ENCOUNTER — Other Ambulatory Visit: Payer: Self-pay

## 2022-11-30 ENCOUNTER — Encounter (INDEPENDENT_AMBULATORY_CARE_PROVIDER_SITE_OTHER): Payer: Self-pay | Admitting: Family Medicine

## 2022-11-30 DIAGNOSIS — J309 Allergic rhinitis, unspecified: Secondary | ICD-10-CM

## 2022-11-30 MED ORDER — AZELASTINE HCL 0.1 % NA SOLN
2.0000 | Freq: Two times a day (BID) | NASAL | 1 refills | Status: DC
Start: 2022-11-30 — End: 2022-11-30

## 2022-11-30 MED ORDER — AZELASTINE HCL 0.1 % NA SOLN
2.0000 | Freq: Two times a day (BID) | NASAL | 1 refills | Status: DC
Start: 2022-11-30 — End: 2023-04-09
  Filled 2022-11-30: qty 30, 25d supply, fill #0
  Filled 2023-03-21: qty 30, 25d supply, fill #1

## 2022-11-30 MED ORDER — AZELASTINE HCL 0.1 % NA SOLN
2.0000 | Freq: Two times a day (BID) | NASAL | 1 refills | Status: DC
Start: 2022-11-30 — End: 2022-11-30
  Filled 2022-11-30: qty 301, fill #0

## 2022-11-30 NOTE — Addendum Note (Signed)
Addended by: Chalmers Cater on: 11/30/2022 01:11 PM   Modules accepted: Orders

## 2022-11-30 NOTE — Telephone Encounter (Signed)
Sent to [facility] today. Roselyn Reef, CMA

## 2022-11-30 NOTE — Telephone Encounter (Signed)
Please see the MyChart message reply(ies) for my assessment and plan.    This patient gave consent for this Medical Advice Message and is aware that it may result in a bill to [employer], as well as the possibility of receiving a bill for a co-payment or deductible. They are an established patient, but are not seeking medical advice exclusively about a problem treated during an in person or video visit in the last seven days. I did not recommend an in person or video visit within seven days of my reply.    I spent a total of 7 minutes cumulative time within 7 days through [employer].  Pt needed prescription of azelastin, sent to pharmacy  Charlton Amor, DO

## 2023-03-21 ENCOUNTER — Other Ambulatory Visit (HOSPITAL_BASED_OUTPATIENT_CLINIC_OR_DEPARTMENT_OTHER): Payer: Self-pay

## 2023-03-26 ENCOUNTER — Encounter: Payer: Self-pay | Admitting: Family Medicine

## 2023-03-26 ENCOUNTER — Other Ambulatory Visit (HOSPITAL_BASED_OUTPATIENT_CLINIC_OR_DEPARTMENT_OTHER): Payer: Self-pay

## 2023-03-26 DIAGNOSIS — K219 Gastro-esophageal reflux disease without esophagitis: Secondary | ICD-10-CM

## 2023-03-26 MED ORDER — PANTOPRAZOLE SODIUM 40 MG PO TBEC
40.0000 mg | DELAYED_RELEASE_TABLET | Freq: Every day | ORAL | 1 refills | Status: AC
Start: 2023-03-26 — End: ?
  Filled 2023-03-26: qty 30, 30d supply, fill #0
  Filled 2023-04-09: qty 30, 30d supply, fill #1
  Filled 2023-05-06 – 2023-05-10 (×2): qty 30, 30d supply, fill #2
  Filled 2023-10-29: qty 30, 30d supply, fill #3

## 2023-03-26 MED ORDER — PANTOPRAZOLE SODIUM 40 MG PO TBEC
40.0000 mg | DELAYED_RELEASE_TABLET | Freq: Every day | ORAL | 1 refills | Status: DC
Start: 2023-03-26 — End: 2023-03-26

## 2023-03-26 NOTE — Addendum Note (Signed)
Addended byRoselyn Reef on: 03/26/2023 11:29 AM   Modules accepted: Orders

## 2023-04-01 ENCOUNTER — Encounter: Payer: Self-pay | Admitting: Family Medicine

## 2023-04-04 ENCOUNTER — Telehealth: Payer: Self-pay | Admitting: Sports Medicine

## 2023-04-04 ENCOUNTER — Encounter: Payer: Self-pay | Admitting: Sports Medicine

## 2023-04-04 NOTE — Telephone Encounter (Signed)
Copied from CRM [id number]. Topic: Referral - Request for Referral >> Apr 04, 2023  1:43 PM Antwanette L wrote: Did the patient discuss referral with their provider in the last year? No. Spoke with Dr. Rodney Langton today while primary pcp Dr. Morey Hummingbird  is on maternity leave.   Appointment offered? Yes  Type of order/referral and detailed reason for visit: Dermatologist  Preference of office, provider, location: [facility].[address] [address]. Phone Number: [phone]  If referral order, have you been seen by this specialty before? No (If Yes, this issue or another issue? When? Where?  Can we respond through MyChart? Yes

## 2023-04-05 ENCOUNTER — Ambulatory Visit (INDEPENDENT_AMBULATORY_CARE_PROVIDER_SITE_OTHER): Payer: Self-pay | Admitting: Sports Medicine

## 2023-04-05 DIAGNOSIS — L821 Other seborrheic keratosis: Secondary | ICD-10-CM

## 2023-04-05 NOTE — Progress Notes (Signed)
    Procedures performed today:    None.  Independent interpretation of notes and tests performed by another provider:   None.  Brief History, Exam, Impression, and Recommendations:    Seborrheic keratosis Couple of lesions right posterior shoulder. On exam they do appear to be seborrheic keratoses, I explained the benign nature of these and the possibility of cryotherapy if she desires. At this point she opts to leave alone which I agree with. She would however like a referral to dermatology, happy to facilitate this as well.    ____________________________________________ Ihor Austin. Benjamin Stain, M.D., ABFM., CAQSM., AME. Primary Care and Sports Medicine [facility]  Adjunct Professor of Family Medicine  [employer]  Restaurant manager, fast food

## 2023-04-05 NOTE — Assessment & Plan Note (Signed)
Couple of lesions right posterior shoulder. On exam they do appear to be seborrheic keratoses, I explained the benign nature of these and the possibility of cryotherapy if she desires. At this point she opts to leave alone which I agree with. She would however like a referral to dermatology, happy to facilitate this as well.

## 2023-04-09 ENCOUNTER — Other Ambulatory Visit: Payer: Self-pay | Admitting: Family Medicine

## 2023-04-09 ENCOUNTER — Other Ambulatory Visit (HOSPITAL_BASED_OUTPATIENT_CLINIC_OR_DEPARTMENT_OTHER): Payer: Self-pay

## 2023-04-09 DIAGNOSIS — J309 Allergic rhinitis, unspecified: Secondary | ICD-10-CM

## 2023-04-09 MED ORDER — AZELASTINE HCL 0.1 % NA SOLN
2.0000 | Freq: Two times a day (BID) | NASAL | 1 refills | Status: DC
  Filled 2023-04-09: qty 30, 25d supply, fill #0
  Filled 2023-05-06: qty 30, 25d supply, fill #1

## 2023-04-10 ENCOUNTER — Other Ambulatory Visit (HOSPITAL_BASED_OUTPATIENT_CLINIC_OR_DEPARTMENT_OTHER): Payer: Self-pay

## 2023-04-10 NOTE — Telephone Encounter (Signed)
 Patient seen in office

## 2023-04-19 ENCOUNTER — Encounter: Payer: BLUE CROSS/BLUE SHIELD | Admitting: Family Medicine

## 2023-04-22 ENCOUNTER — Encounter: Payer: 59 | Admitting: Sports Medicine

## 2023-04-22 ENCOUNTER — Encounter: Payer: BLUE CROSS/BLUE SHIELD | Admitting: Family Medicine

## 2023-04-25 ENCOUNTER — Other Ambulatory Visit (HOSPITAL_BASED_OUTPATIENT_CLINIC_OR_DEPARTMENT_OTHER): Payer: Self-pay

## 2023-04-25 ENCOUNTER — Ambulatory Visit (INDEPENDENT_AMBULATORY_CARE_PROVIDER_SITE_OTHER): Admitting: Sports Medicine

## 2023-04-25 DIAGNOSIS — Z23 Encounter for immunization: Secondary | ICD-10-CM | POA: Diagnosis not present

## 2023-04-25 DIAGNOSIS — E66811 Obesity, class 1: Secondary | ICD-10-CM

## 2023-04-25 DIAGNOSIS — L821 Other seborrheic keratosis: Secondary | ICD-10-CM

## 2023-04-25 DIAGNOSIS — Z0000 Encounter for general adult medical examination without abnormal findings: Secondary | ICD-10-CM

## 2023-04-25 MED ORDER — ZEPBOUND 2.5 MG/0.5ML ~~LOC~~ SOAJ
2.5000 mg | SUBCUTANEOUS | 0 refills | Status: DC
  Filled 2023-04-25: qty 2, 28d supply, fill #0

## 2023-04-25 NOTE — Addendum Note (Signed)
 Addended by: Samule Dry on: 04/25/2023 04:28 PM   Modules accepted: Orders

## 2023-04-25 NOTE — Assessment & Plan Note (Signed)
 There are a couple of benign seborrheic keratoses right posterior shoulder. We can do cryotherapy if needed, it sounds like she also got an appointment with dermatology, I did inform her that with the benignity of these lesions we can leave them alone.

## 2023-04-25 NOTE — Progress Notes (Signed)
 Subjective:    CC: Annual Physical Exam  HPI:  This patient is here for their annual physical  I reviewed the past medical history, family history, social history, surgical history, and allergies today and no changes were needed.  Please see the problem list section below in epic for further details.  Past Medical History: Past Medical History:  Diagnosis Date   GERD (gastroesophageal reflux disease)    Past Surgical History: Past Surgical History:  Procedure Laterality Date   NO PAST SURGERIES     Social History: Social History   Socioeconomic History   Marital status: Married    Spouse name: baha   Number of children: 1   Years of education: Not on file   Highest education level: Associate degree: occupational, Scientist, product/process development, or vocational program  Occupational History   Not on file  Tobacco Use   Smoking status: Never   Smokeless tobacco: Never  Vaping Use   Vaping status: Former   Substances: Nicotine  Substance and Sexual Activity   Alcohol use: Not Currently    Comment: very rareky   Drug use: Never   Sexual activity: Yes    Partners: Male    Birth control/protection: Condom  Other Topics Concern   Not on file  Social History Narrative   ** Merged History Encounter **       Working out 2 times a week.    Social Drivers of Corporate investment banker Strain: Low Risk  (04/25/2023)   Overall Financial Resource Strain (CARDIA)    Difficulty of Paying Living Expenses: Not hard at all  Food Insecurity: No Food Insecurity (04/25/2023)   Hunger Vital Sign    Worried About Running Out of Food in the Last Year: Never true    Ran Out of Food in the Last Year: Never true  Transportation Needs: No Transportation Needs (04/25/2023)   PRAPARE - Administrator, Civil Service (Medical): No    Lack of Transportation (Non-Medical): No  Physical Activity: Insufficiently Active (04/25/2023)   Exercise Vital Sign    Days of Exercise per Week: 3 days    Minutes  of Exercise per Session: 30 min  Stress: No Stress Concern Present (04/25/2023)   [employer] of Occupational Health - Occupational Stress Questionnaire    Feeling of Stress : Not at all  Social Connections: Moderately Isolated (04/25/2023)   Social Connection and Isolation Panel [NHANES]    Frequency of Communication with Friends and Family: More than three times a week    Frequency of Social Gatherings with Friends and Family: More than three times a week    Attends Religious Services: Never    Database administrator or Organizations: No    Attends Engineer, structural: Not on file    Marital Status: Married   Family History: Family History  Problem Relation Age of Onset   Fibromyalgia Mother    COPD Father    Anxiety disorder Father    Depression Father    Breast cancer Maternal Aunt    Tongue cancer Paternal Uncle    Diabetes Maternal Grandmother    Heart attack Maternal Grandfather    Diabetes Paternal Grandmother    Pancreatic cancer Paternal Grandfather    Heart failure Father    Thyroid disease Father    Allergies: No Known Allergies Medications: See med rec.  Review of Systems: No headache, visual changes, nausea, vomiting, diarrhea, constipation, dizziness, abdominal pain, skin rash, fevers, chills, night sweats, swollen  lymph nodes, weight loss, chest pain, body aches, joint swelling, muscle aches, shortness of breath, mood changes, visual or auditory hallucinations.  Objective:    General: Well Developed, well nourished, and in no acute distress.  Neuro: Alert and oriented x3, extra-ocular muscles intact, sensation grossly intact. Cranial nerves II through XII are intact, motor, sensory, and coordinative functions are all intact. HEENT: Normocephalic, atraumatic, pupils equal round reactive to light, neck supple, no masses, no lymphadenopathy, thyroid nonpalpable. Oropharynx, nasopharynx, external ear canals are unremarkable. Skin: Warm and dry, no  rashes noted.  Seborrheic keratoses right posterior shoulder, skin tag left neck Cardiac: Regular rate and rhythm, no murmurs rubs or gallops.  Respiratory: Clear to auscultation bilaterally. Not using accessory muscles, speaking in full sentences.  Abdominal: Soft, nontender, nondistended, positive bowel sounds, no masses, no organomegaly.  Musculoskeletal: Shoulder, elbow, wrist, hip, knee, ankle stable, and with full range of motion.  Impression and Recommendations:    The patient was counselled, risk factors were discussed, anticipatory guidance given.  Annual physical exam This is a pleasant 38 year old female, she did recently establish care with Dr. Tamera Punt, now that Dr. Tamera Punt is leaving she needs an annual physical. Today we did her physical exam, she did have a couple of seborrheic keratoses on the right posterior shoulder and a skin tag left neck. She is due for flu and Tdap, we will defer flu until the fall, she will get Tdap today. Return to see [name] in a year, she does understand she needs to find a new PCP.  Seborrheic keratosis There are a couple of benign seborrheic keratoses right posterior shoulder. We can do cryotherapy if needed, it sounds like she also got an appointment with dermatology, I did inform her that with the benignity of these lesions we can leave them alone.  Obesity (BMI 30.0-34.9) This pleasant 38 year old female also has obesity, she is struggled with her weight for a long time, she will be enrolled in a multidisciplinary weight loss program with calorie counting and an exercise prescription, she has no contraindications to GLP-1 treatment and she would not be taking any other weight loss medications. We will start Zepbound, she understands that this will take a PA, if not on formulary we can try Wegovy, if both are plan exclusions then we will try compounded tirzepatide.   ____________________________________________ Ihor Austin. Benjamin Stain, M.D., ABFM.,  CAQSM., AME. Primary Care and Sports Medicine [facility]  Adjunct Professor of Family Medicine  [place] [employer] [facility] [employer] [facility] [employer]  Restaurant manager, fast food

## 2023-04-25 NOTE — Assessment & Plan Note (Signed)
 This pleasant 38 year old female also has obesity, she is struggled with her weight for a long time, she will be enrolled in a multidisciplinary weight loss program with calorie counting and an exercise prescription, she has no contraindications to GLP-1 treatment and she would not be taking any other weight loss medications. We will start Zepbound, she understands that this will take a PA, if not on formulary we can try Wegovy, if both are plan exclusions then we will try compounded tirzepatide.

## 2023-04-25 NOTE — Assessment & Plan Note (Signed)
 This is a pleasant 38 year old female, she did recently establish care with Dr. Tamera Punt, now that Dr. Tamera Punt is leaving she needs an annual physical. Today we did her physical exam, she did have a couple of seborrheic keratoses on the right posterior shoulder and a skin tag left neck. She is due for flu and Tdap, we will defer flu until the fall, she will get Tdap today. Return to see [name] in a year, she does understand she needs to find a new PCP.

## 2023-04-26 ENCOUNTER — Other Ambulatory Visit (HOSPITAL_BASED_OUTPATIENT_CLINIC_OR_DEPARTMENT_OTHER): Payer: Self-pay

## 2023-04-29 ENCOUNTER — Other Ambulatory Visit (HOSPITAL_BASED_OUTPATIENT_CLINIC_OR_DEPARTMENT_OTHER): Payer: Self-pay

## 2023-04-30 ENCOUNTER — Other Ambulatory Visit (HOSPITAL_BASED_OUTPATIENT_CLINIC_OR_DEPARTMENT_OTHER): Payer: Self-pay

## 2023-05-01 ENCOUNTER — Other Ambulatory Visit (HOSPITAL_BASED_OUTPATIENT_CLINIC_OR_DEPARTMENT_OTHER): Payer: Self-pay

## 2023-05-01 ENCOUNTER — Telehealth: Payer: Self-pay | Admitting: Family Medicine

## 2023-05-01 NOTE — Telephone Encounter (Signed)
 Pt wants to check status of Zepbound 2.5mg /0.16ml Prior Auth. Please advise.  [phone]

## 2023-05-02 ENCOUNTER — Other Ambulatory Visit (HOSPITAL_BASED_OUTPATIENT_CLINIC_OR_DEPARTMENT_OTHER): Payer: Self-pay

## 2023-05-06 ENCOUNTER — Other Ambulatory Visit (HOSPITAL_BASED_OUTPATIENT_CLINIC_OR_DEPARTMENT_OTHER): Payer: Self-pay

## 2023-05-07 ENCOUNTER — Other Ambulatory Visit (HOSPITAL_BASED_OUTPATIENT_CLINIC_OR_DEPARTMENT_OTHER): Payer: Self-pay

## 2023-05-08 ENCOUNTER — Other Ambulatory Visit (HOSPITAL_BASED_OUTPATIENT_CLINIC_OR_DEPARTMENT_OTHER): Payer: Self-pay

## 2023-05-08 NOTE — Telephone Encounter (Signed)
 PA required. Thanks in advance.

## 2023-05-09 ENCOUNTER — Telehealth: Payer: Self-pay

## 2023-05-09 ENCOUNTER — Other Ambulatory Visit (HOSPITAL_BASED_OUTPATIENT_CLINIC_OR_DEPARTMENT_OTHER): Payer: Self-pay

## 2023-05-09 ENCOUNTER — Other Ambulatory Visit (HOSPITAL_COMMUNITY): Payer: Self-pay

## 2023-05-09 ENCOUNTER — Encounter (INDEPENDENT_AMBULATORY_CARE_PROVIDER_SITE_OTHER): Payer: Self-pay | Admitting: Sports Medicine

## 2023-05-09 DIAGNOSIS — E66811 Obesity, class 1: Secondary | ICD-10-CM

## 2023-05-09 NOTE — Telephone Encounter (Signed)
 Task completed. A MyChart msg was sent to the patient regarding the insurance denial for Zepbound.

## 2023-05-09 NOTE — Telephone Encounter (Signed)
 Pharmacy Patient Advocate Encounter   Received notification from Pt Calls Messages that prior authorization for Zepbound 2.5 is required/requested.   Insurance verification completed.   The patient is insured through  [employer]  .   Per test claim: Pt has plan benefit exclusion. This medication will not be covered by pt insurance.

## 2023-05-09 NOTE — Telephone Encounter (Signed)
 Per the RX PA team - Zepbound 2.5 mg will not be covered by the insurance. Medication is a plan benefit exclusion.

## 2023-05-10 ENCOUNTER — Other Ambulatory Visit (HOSPITAL_BASED_OUTPATIENT_CLINIC_OR_DEPARTMENT_OTHER): Payer: Self-pay

## 2023-05-10 MED ORDER — TIRZEPATIDE 10 MG/0.5ML ~~LOC~~ SOAJ: SUBCUTANEOUS | 11 refills | Status: DC

## 2023-05-10 NOTE — Telephone Encounter (Signed)

## 2023-06-20 ENCOUNTER — Encounter: Payer: Self-pay | Admitting: Family Medicine

## 2023-07-02 ENCOUNTER — Ambulatory Visit: Admitting: Medical-Surgical

## 2023-07-11 ENCOUNTER — Ambulatory Visit: Admitting: Medical-Surgical

## 2023-08-07 ENCOUNTER — Encounter: Admitting: Urgent Care

## 2023-08-19 ENCOUNTER — Ambulatory Visit (INDEPENDENT_AMBULATORY_CARE_PROVIDER_SITE_OTHER): Admitting: Medical-Surgical

## 2023-08-19 VITALS — BP 115/77 | HR 66 | Resp 20 | Ht 67.0 in | Wt 199.0 lb

## 2023-08-19 DIAGNOSIS — K649 Unspecified hemorrhoids: Secondary | ICD-10-CM

## 2023-08-19 DIAGNOSIS — O24419 Gestational diabetes mellitus in pregnancy, unspecified control: Secondary | ICD-10-CM

## 2023-08-19 DIAGNOSIS — Z13220 Encounter for screening for lipoid disorders: Secondary | ICD-10-CM

## 2023-08-19 DIAGNOSIS — E66811 Obesity, class 1: Secondary | ICD-10-CM | POA: Diagnosis not present

## 2023-08-19 DIAGNOSIS — Z0000 Encounter for general adult medical examination without abnormal findings: Secondary | ICD-10-CM

## 2023-08-19 DIAGNOSIS — Z3A00 Weeks of gestation of pregnancy not specified: Secondary | ICD-10-CM

## 2023-08-19 LAB — POCT GLYCOSYLATED HEMOGLOBIN (HGB A1C): Hemoglobin A1C: 5.4 % (ref 4.0–5.6)

## 2023-08-19 NOTE — Progress Notes (Signed)
        Established patient visit  History, exam, impression, and plan:  1. Obesity (BMI 30.0-34.9) (Primary) Very pleasant 38 year old female presenting today for follow-up on obesity.  At previous visit, she had discussed starting Zepbound  however this was never approved by insurance.  She had a prescription written for the compounded Zepbound  but did not start this due to cost.  Reports that she has been working very hard on moving more and limiting her caloric intake.  Currently eating 1450 cal daily with approximately 115g of protein.  Exercising on the treadmill most days.  Over the last month, has lost 15 pounds which is very impressive.  At this point, recommend she continue with dietary modification and exercise.  2. Hemorrhoids, unspecified hemorrhoid type Notes that when she was pregnant, she developed a hemorrhoid however her OB/GYN told her this was an anal fissure.  After that, the area remained and she was evaluated by her previous provider.  That provider told her it was a skin tag.  She came here to establish with Dr. Vicci and was told that it was a hemorrhoid.  Is confused and would like reevaluation for definitive diagnosis.  Notes that it rarely swells and becomes erythematous and painful.  No straining with bowel movements and does not sit for prolonged time on the toilet.  On evaluation, she does have a large external hemorrhoid with no thrombosis or current inflammation.  Reviewed options for management.  This is cosmetically and physically bothersome for her and she would like to look into getting it removed.  Referral to colorectal surgery placed today.  3. Healthcare maintenance Has not had blood work in quite a while and is interested in getting this done today.  Looks like she has not had an annual physical but we will go ahead and place orders for healthcare maintenance.  Checking CMP, CBC, and lipid panel today. - CBC - CMP14+EGFR - Lipid panel  4. Lipid  screening Checking lipids today. - Lipid panel  5. Gestational diabetes mellitus (GDM), antepartum, gestational diabetes method of control unspecified History of gestational diabetes however has not had any issues since then.  POCT hemoglobin A1c 5.4% today. - POCT HgB A1C  Procedures performed this visit: None.  Return for annual physical exam at your convenience.  __________________________________ Zada FREDRIK Palin, DNP, APRN, FNP-BC Primary Care and Sports Medicine [facility]

## 2023-08-20 ENCOUNTER — Ambulatory Visit: Payer: Self-pay | Admitting: Medical-Surgical

## 2023-08-20 LAB — CMP14+EGFR
ALT: 17 IU/L (ref 0–32)
AST: 16 IU/L (ref 0–40)
Albumin: 4.6 g/dL (ref 3.9–4.9)
Alkaline Phosphatase: 70 IU/L (ref 44–121)
BUN/Creatinine Ratio: 19 (ref 9–23)
BUN: 11 mg/dL (ref 6–20)
Bilirubin Total: 0.4 mg/dL (ref 0.0–1.2)
CO2: 23 mmol/L (ref 20–29)
Calcium: 9.5 mg/dL (ref 8.7–10.2)
Chloride: 105 mmol/L (ref 96–106)
Creatinine, Ser: 0.59 mg/dL (ref 0.57–1.00)
Globulin, Total: 2.2 g/dL (ref 1.5–4.5)
Glucose: 92 mg/dL (ref 70–99)
Potassium: 4.6 mmol/L (ref 3.5–5.2)
Sodium: 142 mmol/L (ref 134–144)
Total Protein: 6.8 g/dL (ref 6.0–8.5)
eGFR: 120 mL/min/1.73 (ref >59)

## 2023-08-20 LAB — LIPID PANEL
Chol/HDL Ratio: 5.3 ratio — ABNORMAL HIGH (ref 0.0–4.4)
Cholesterol, Total: 200 mg/dL — ABNORMAL HIGH (ref 100–199)
HDL: 38 mg/dL — ABNORMAL LOW (ref >39)
LDL Chol Calc (NIH): 121 mg/dL — ABNORMAL HIGH (ref 0–99)
Triglycerides: 230 mg/dL — ABNORMAL HIGH (ref 0–149)
VLDL Cholesterol Cal: 41 mg/dL — ABNORMAL HIGH (ref 5–40)

## 2023-08-20 LAB — CBC
Hematocrit: 42.8 % (ref 34.0–46.6)
Hemoglobin: 13.7 g/dL (ref 11.1–15.9)
MCH: 30.6 pg (ref 26.6–33.0)
MCHC: 32 g/dL (ref 31.5–35.7)
MCV: 96 fL (ref 79–97)
Platelets: 241 x10E3/uL (ref 150–450)
RBC: 4.47 x10E6/uL (ref 3.77–5.28)
RDW: 12.4 % (ref 11.7–15.4)
WBC: 8.2 x10E3/uL (ref 3.4–10.8)

## 2023-09-02 ENCOUNTER — Encounter: Admitting: Urgent Care

## 2023-09-05 ENCOUNTER — Ambulatory Visit: Admitting: Medical-Surgical

## 2023-09-19 ENCOUNTER — Ambulatory Visit: Admitting: Medical-Surgical

## 2023-10-03 NOTE — Progress Notes (Deleted)
   Established patient visit  History, exam, impression, and plan:  No problem-specific Assessment & Plan notes found for this encounter.   ROS  Physical Exam  Procedures performed this visit: None.  No follow-ups on file.  __________________________________ Thayer Ohm, DNP, APRN, FNP-BC Primary Care and Sports Medicine [facility]

## 2023-10-04 ENCOUNTER — Ambulatory Visit: Admitting: Medical-Surgical

## 2023-10-15 ENCOUNTER — Encounter: Payer: Self-pay | Admitting: Sports Medicine

## 2023-10-16 ENCOUNTER — Ambulatory Visit: Admitting: Medical-Surgical

## 2023-10-28 ENCOUNTER — Encounter: Payer: Self-pay | Admitting: Medical-Surgical

## 2023-10-28 ENCOUNTER — Ambulatory Visit (INDEPENDENT_AMBULATORY_CARE_PROVIDER_SITE_OTHER): Admitting: Medical-Surgical

## 2023-10-28 ENCOUNTER — Other Ambulatory Visit (HOSPITAL_COMMUNITY)
Admission: RE | Admit: 2023-10-28 | Discharge: 2023-10-28 | Disposition: A | Discharge status: Home / Self Care | Source: Ambulatory Visit | Attending: Medical-Surgical | Admitting: Medical-Surgical

## 2023-10-28 VITALS — BP 109/75 | HR 83 | Resp 20 | Ht 67.0 in | Wt 207.0 lb

## 2023-10-28 DIAGNOSIS — Z124 Encounter for screening for malignant neoplasm of cervix: Secondary | ICD-10-CM

## 2023-10-28 NOTE — Progress Notes (Signed)
        Established patient visit  History, exam, impression, and plan:  1. Cervical cancer screening (Primary) Pleasant 38 year old female presenting today for completion of cervical cancer screening. Her last pap smear was done during her pregnancy and she reports her child is 46 years old. Denies abnormal pap smears. No vaginal concerns. Declines STI testing. Pap smear with HPV cotesting completed today. If normal, will be due to repeat in 5 years.   Physical Exam Vitals reviewed. Exam conducted with a chaperone present.  Constitutional:      General: She is not in acute distress.    Appearance: Normal appearance. She is not ill-appearing.  HENT:     Head: Normocephalic and atraumatic.  Cardiovascular:     Rate and Rhythm: Normal rate and regular rhythm.  Pulmonary:     Effort: Pulmonary effort is normal. No respiratory distress.  Genitourinary:    General: Normal vulva.     Labia:        Right: No rash, tenderness, lesion or injury.        Left: No rash, tenderness, lesion or injury.      Vagina: Normal.     Cervix: Normal.     Uterus: Normal.      Adnexa: Right adnexa normal and left adnexa normal.  Neurological:     Mental Status: She is alert.    Procedures performed this visit: None.  Return if symptoms worsen or fail to improve.  __________________________________ Zada FREDRIK Palin, DNP, APRN, FNP-BC Primary Care and Sports Medicine [facility]

## 2023-10-28 NOTE — Addendum Note (Signed)
 Addended by: FANNY NIELS CROME on: 10/28/2023 11:36 AM   Modules accepted: Orders

## 2023-10-29 ENCOUNTER — Ambulatory Visit: Payer: Self-pay | Admitting: Medical-Surgical

## 2023-10-29 ENCOUNTER — Other Ambulatory Visit (HOSPITAL_BASED_OUTPATIENT_CLINIC_OR_DEPARTMENT_OTHER): Payer: Self-pay

## 2023-10-29 ENCOUNTER — Other Ambulatory Visit: Payer: Self-pay | Admitting: Physician Assistant

## 2023-10-29 ENCOUNTER — Other Ambulatory Visit: Payer: Self-pay

## 2023-10-29 DIAGNOSIS — J309 Allergic rhinitis, unspecified: Secondary | ICD-10-CM

## 2023-10-29 LAB — CYTOLOGY - PAP
Adequacy: ABSENT
Comment: NEGATIVE
Diagnosis: NEGATIVE
High risk HPV: NEGATIVE

## 2023-10-29 MED ORDER — AZELASTINE HCL 0.1 % NA SOLN
2.0000 | Freq: Two times a day (BID) | NASAL | 1 refills | Status: AC
  Filled 2023-10-29: qty 30, 25d supply, fill #0
  Filled 2023-11-19: qty 30, 25d supply, fill #1

## 2023-11-28 ENCOUNTER — Ambulatory Visit: Admitting: Dermatology

## 2023-11-28 ENCOUNTER — Encounter: Payer: Self-pay | Admitting: Dermatology

## 2023-11-28 VITALS — BP 117/86 | HR 72

## 2023-11-28 DIAGNOSIS — D225 Melanocytic nevi of trunk: Secondary | ICD-10-CM

## 2023-11-28 DIAGNOSIS — W908XXA Exposure to other nonionizing radiation, initial encounter: Secondary | ICD-10-CM | POA: Diagnosis not present

## 2023-11-28 DIAGNOSIS — Z1283 Encounter for screening for malignant neoplasm of skin: Secondary | ICD-10-CM | POA: Diagnosis not present

## 2023-11-28 DIAGNOSIS — D489 Neoplasm of uncertain behavior, unspecified: Secondary | ICD-10-CM

## 2023-11-28 DIAGNOSIS — L814 Other melanin hyperpigmentation: Secondary | ICD-10-CM | POA: Diagnosis not present

## 2023-11-28 DIAGNOSIS — L821 Other seborrheic keratosis: Secondary | ICD-10-CM

## 2023-11-28 DIAGNOSIS — L578 Other skin changes due to chronic exposure to nonionizing radiation: Secondary | ICD-10-CM

## 2023-11-28 NOTE — Progress Notes (Signed)
 New Patient Visit   Subjective  Holly Rivas is a 38 y.o. female who presents for the following: seborrheic keratosis   Patient states she has Seborrheic Keratoses located on upper back that she would like to have examined. Patient reports the areas have been there for 3 years. She reports the areas are not bothersome. She states that the areas have not spread. Patient reports she has not previously been treated for these areas.  Patient does have Hx of bx from many years ago. Patient reports that the pathology was benign.  Patient reports family history of skin cancer (mom).  Patient is not nursing, pregnant or trying to conceive.   The patient has spots, moles and lesions to be evaluated, some may be new or changing and the patient may have concern these could be cancer.  Patient would like to discuss a FBSE  The following portions of the chart were reviewed this encounter and updated as appropriate: medications, allergies, medical history  Review of Systems:  No other skin or systemic complaints except as noted in HPI or Assessment and Plan.  Objective  Well appearing patient in no apparent distress; mood and affect are within normal limits. A focused examination was performed of the following areas: Upper Back  Relevant exam findings are noted in the Assessment and Plan.                 Right Abdomen (side) - Upper 7mm irregular brown macule Left Upper Back 2cm patchy irregular brown macule  Assessment & Plan  SEBORRHEIC KERATOSIS - Stuck-on, waxy, tan-brown papules and/or plaques  - Benign-appearing - Discussed benign etiology and prognosis. - Observe - Call for any changes  IRRITATED SEBORRHEIC KERATOSIS (2) Right Upper Back (2) Destruction of lesion - Right Upper Back (2)  Destruction method: cryotherapy   Timeout:  patient name, date of birth, surgical site, and procedure verified Cryotherapy cycles:  3 Outcome: patient tolerated procedure well with  no complications   Post-procedure details: wound care instructions given    NEOPLASM OF UNCERTAIN BEHAVIOR (2) Right Abdomen (side) - Upper Epidermal / dermal shaving  Lesion diameter (cm):  0.7 Informed consent: discussed and consent obtained   Timeout: patient name, date of birth, surgical site, and procedure verified   Instrument used: DermaBlade   Hemostasis achieved with: aluminum  chloride   Outcome: patient tolerated procedure well   Post-procedure details: wound care instructions given    Specimen A - Surgical pathology Differential Diagnosis: r/o DN  Check Margins: No Left Upper Back Epidermal / dermal shaving  Lesion diameter (cm):  2 Informed consent: discussed and consent obtained   Timeout: patient name, date of birth, surgical site, and procedure verified   Instrument used: DermaBlade   Hemostasis achieved with: aluminum  chloride   Outcome: patient tolerated procedure well   Post-procedure details: wound care instructions given    Specimen B - Surgical pathology Differential Diagnosis: r/o DN  Check Margins: No  FBSE Upper Body:  LENTIGINES, SEBORRHEIC KERATOSES, HEMANGIOMAS - Benign normal skin lesions - Benign-appearing - Call for any changes  MELANOCYTIC NEVI - Tan-brown and/or pink-flesh-colored symmetric macules and papules - Benign appearing on exam today - Observation - Call clinic for new or changing moles - Recommend daily use of broad spectrum spf 30+ sunscreen to sun-exposed areas.   ACTINIC DAMAGE - Chronic condition, secondary to cumulative UV/sun exposure - diffuse scaly erythematous macules with underlying dyspigmentation - Recommend daily broad spectrum sunscreen SPF 30+ to sun-exposed areas,  reapply every 2 hours as needed.  - Staying in the shade or wearing long sleeves, sun glasses (UVA+UVB protection) and wide brim hats (4-inch brim around the entire circumference of the hat) are also recommended for sun protection.  - Call for new  or changing lesions.  SKIN CANCER SCREENING PERFORMED TODAY   2 weeks or next available appointment for FBSE F/U  I, Lyle Cords, as acting as a Neurosurgeon for [employer], DO .   Documentation: I have reviewed the above documentation for accuracy and completeness, and I agree with the above.  Delon Lenis, DO

## 2023-11-28 NOTE — Patient Instructions (Addendum)

## 2023-11-29 LAB — SURGICAL PATHOLOGY

## 2023-12-03 ENCOUNTER — Ambulatory Visit: Payer: Self-pay | Admitting: Dermatology

## 2023-12-03 DIAGNOSIS — D239 Other benign neoplasm of skin, unspecified: Secondary | ICD-10-CM | POA: Insufficient documentation

## 2023-12-03 NOTE — Progress Notes (Signed)
 Hi Holly Rivas,   Please call pt and notify that their bx results showed an abnormal mole that requires a full excision in office with Holly Rivas   1. Skin, right abdomen (side) - upper :  --> SE w/ Holly Rivas      DYSPLASTIC COMPOUND NEVUS WITH SEVERE ATYPIA, PERIPHERAL AND DEEP MARGINS       INVOLVED, SEE DESCRIPTION        2. Skin, left upper back : --> SE w/ Holly Rivas      DYSPLASTIC COMPOUND NEVUS WITH SEVERE ATYPIA, PERIPHERAL MARGIN INVOLVED, SEE       DESCRIPTION

## 2023-12-04 ENCOUNTER — Ambulatory Visit: Admitting: Dermatology

## 2023-12-04 ENCOUNTER — Encounter: Payer: Self-pay | Admitting: Dermatology

## 2023-12-04 VITALS — BP 126/85 | HR 68

## 2023-12-04 DIAGNOSIS — W908XXA Exposure to other nonionizing radiation, initial encounter: Secondary | ICD-10-CM | POA: Diagnosis not present

## 2023-12-04 DIAGNOSIS — D2271 Melanocytic nevi of right lower limb, including hip: Secondary | ICD-10-CM

## 2023-12-04 DIAGNOSIS — D229 Melanocytic nevi, unspecified: Secondary | ICD-10-CM

## 2023-12-04 DIAGNOSIS — D225 Melanocytic nevi of trunk: Secondary | ICD-10-CM | POA: Diagnosis not present

## 2023-12-04 DIAGNOSIS — L578 Other skin changes due to chronic exposure to nonionizing radiation: Secondary | ICD-10-CM | POA: Diagnosis not present

## 2023-12-04 DIAGNOSIS — L821 Other seborrheic keratosis: Secondary | ICD-10-CM

## 2023-12-04 DIAGNOSIS — D485 Neoplasm of uncertain behavior of skin: Secondary | ICD-10-CM | POA: Diagnosis not present

## 2023-12-04 DIAGNOSIS — Z1283 Encounter for screening for malignant neoplasm of skin: Secondary | ICD-10-CM | POA: Diagnosis not present

## 2023-12-04 DIAGNOSIS — L814 Other melanin hyperpigmentation: Secondary | ICD-10-CM

## 2023-12-04 NOTE — Progress Notes (Signed)
 Total Body Skin Exam (TBSE) Visit   Subjective  Holly Rivas is a 38 y.o. female who presents for the following: Skin Cancer Screening and Full Body Skin Exam  Patient presents today for follow up visit for TBSE. Patient was last evaluated on 11/28/2023 . Patient denies medication changes. Patient reports she does not have spots, moles and lesions of concern to be evaluated. Patient reports throughout her lifetime she has had severe sun exposure. Currently, patient reports if she has excessive sun exposure, she does apply sunscreen and/or wears protective coverings. 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 Patient denies family history of skin cancers.  Patient reports she is not actively pregnant, trying to conceive or nursing.  The following portions of the chart were reviewed this encounter and updated as appropriate: medications, allergies, medical history  Review of Systems:  No other skin or systemic complaints except as noted in HPI or Assessment and Plan.  Objective  Well appearing patient in no apparent distress; mood and affect are within normal limits.  A full examination was performed including scalp, head, eyes, ears, nose, lips, neck, chest, axillae, abdomen, back, buttocks, bilateral upper extremities, bilateral lower extremities, hands, feet, fingers, toes, fingernails, and toenails. All findings within normal limits unless otherwise noted below.   Relevant physical exam findings are noted in the Assessment and Plan.            Left Upper back -Superior 5 mm Irregular Dark Brown Macule Right Upper Thigh - Posterior 6 mm Irregular dark Brown Macule  Assessment & Plan   LENTIGINES, SEBORRHEIC KERATOSES, HEMANGIOMAS - Benign normal skin lesions - Benign-appearing - Call for any changes  MELANOCYTIC NEVI - Tan-brown and/or pink-flesh-colored symmetric macules and papules - Benign appearing on exam today - Observation -  Call clinic for new or changing moles - Recommend daily use of broad spectrum spf 30+ sunscreen to sun-exposed areas.   MODERATE ACTINIC DAMAGE - Chronic condition, secondary to cumulative UV/sun exposure - diffuse scaly erythematous macules with underlying dyspigmentation - Recommend daily broad spectrum sunscreen SPF 30+ to sun-exposed areas, reapply every 2 hours as needed.  - Staying in the shade or wearing long sleeves, sun glasses (UVA+UVB protection) and wide brim hats (4-inch brim around the entire circumference of the hat) are also recommended for sun protection.  - Call for new or changing lesions.  SKIN CANCER SCREENING PERFORMED TODAY.  NEOPLASM OF UNCERTAIN BEHAVIOR OF SKIN (2) Left Upper back -Superior Epidermal / dermal shaving  Lesion diameter (cm):  0.5 Informed consent: discussed and consent obtained   Timeout: patient name, date of birth, surgical site, and procedure verified   Procedure prep:  Patient was prepped and draped in usual sterile fashion Prep type:  Isopropyl alcohol Anesthesia: the lesion was anesthetized in a standard fashion   Anesthetic:  1% lidocaine  w/ epinephrine 1-100,000 buffered w/ 8.4% NaHCO3 Instrument used: DermaBlade   Hemostasis achieved with: aluminum  chloride   Outcome: patient tolerated procedure well   Post-procedure details: sterile dressing applied and wound care instructions given   Dressing type: petrolatum   Additional details:  Pt aware that benign results will be sent to mychart and the staff will call abnormal results will  Specimen A - Surgical pathology Differential Diagnosis: R/O DN  Check Margins: No Right Upper Thigh - Posterior Epidermal / dermal shaving  Lesion diameter (cm):  0.6 Informed consent: discussed and consent obtained   Timeout: patient name,  date of birth, surgical site, and procedure verified   Procedure prep:  Patient was prepped and draped in usual sterile fashion Prep type:  Isopropyl  alcohol Anesthesia: the lesion was anesthetized in a standard fashion   Anesthetic:  1% lidocaine  w/ epinephrine 1-100,000 buffered w/ 8.4% NaHCO3 Instrument used: DermaBlade   Hemostasis achieved with: aluminum  chloride   Outcome: patient tolerated procedure well   Post-procedure details: sterile dressing applied and wound care instructions given   Dressing type: petrolatum   Additional details:  Pt aware that benign results will be sent to mychart and the staff will call abnormal results will  Specimen B - Surgical pathology Differential Diagnosis: R/O DN  Check Margins: No SKIN EXAM FOR MALIGNANT NEOPLASM   MULTIPLE BENIGN MELANOCYTIC NEVI   ACTINIC SKIN DAMAGE   SEBORRHEIC KERATOSIS   LENTIGINES    Return in about 6 months (around 06/03/2024) for TBSE.  I, Jetta Ager, am acting as Neurosurgeon for [employer], DO.  Documentation: I have reviewed the above documentation for accuracy and completeness, and I agree with the above.  Delon Lenis, DO

## 2023-12-04 NOTE — Patient Instructions (Addendum)
 Patient Handout: Wound Care for Skin Biopsy Site  Taking Care of Your Skin Biopsy Site  Proper care of the biopsy site is essential for promoting healing and minimizing scarring. This handout provides instructions on how to care for your biopsy site to ensure optimal recovery.  1. Cleaning the Wound:  Clean the biopsy site daily with gentle soap and water. Gently pat the area dry with a clean, soft towel. Avoid harsh scrubbing or rubbing the area, as this can irritate the skin and delay healing.  2. Applying Aquaphor and Bandage:  After cleaning the wound, apply a thin layer of Aquaphor ointment to the biopsy site. Cover the area with a sterile bandage to protect it from dirt, bacteria, and friction. Change the bandage daily or as needed if it becomes soiled or wet.  3. Continued Care for One Week:  Repeat the cleaning, Aquaphor application, and bandaging process daily for one week following the biopsy procedure. Keeping the wound clean and moist during this initial healing period will help prevent infection and promote optimal healing.  4. Massaging Aquaphor into the Area:  ---After one week, discontinue the use of bandages but continue to apply Aquaphor to the biopsy site. ----Gently massage the Aquaphor into the area using circular motions. ---Massaging the skin helps to promote circulation and prevent the formation of scar tissue.   Additional Tips:  Avoid exposing the biopsy site to direct sunlight during the healing process, as this can cause hyperpigmentation or worsen scarring. If you experience any signs of infection, such as increased redness, swelling, warmth, or drainage from the wound, contact your healthcare provider immediately. Follow any additional instructions provided by your healthcare provider for caring for the biopsy site and managing any discomfort. Conclusion:  Taking proper care of your skin biopsy site is crucial for ensuring optimal healing and  minimizing scarring. By following these instructions for cleaning, applying Aquaphor, and massaging the area, you can promote a smooth and successful recovery. If you have any questions or concerns about caring for your biopsy site, don't hesitate to contact your healthcare provider for guidance.  0     Important Information   Due to recent changes in healthcare laws, you may see results of your pathology and/or laboratory studies on MyChart before the doctors have had a chance to review them. We understand that in some cases there may be results that are confusing or concerning to you. Please understand that not all results are received at the same time and often the doctors may need to interpret multiple results in order to provide you with the best plan of care or course of treatment. Therefore, we ask that you please give us  2 business days to thoroughly review all your results before contacting the office for clarification. Should we see a critical lab result, you will be contacted sooner.     If You Need Anything After Your Visit   If you have any questions or concerns for your doctor, please call our main line at [phone]. If no one answers, please leave a voicemail as directed and we will return your call as soon as possible. Messages left after 4 pm will be answered the following business day.    You may also send us  a message via MyChart. We typically respond to MyChart messages within 1-2 business days.  For prescription refills, please ask your pharmacy to contact our office. Our fax number is [phone].  If you have an urgent issue when the clinic is  closed that cannot wait until the next business day, you can page your doctor at the number below.     Please note that while we do our best to be available for urgent issues outside of office hours, we are not available 24/7.    If you have an urgent issue and are unable to reach us , you may choose to seek medical care at your  doctor's office, retail clinic, urgent care center, or emergency room.   If you have a medical emergency, please immediately call 911 or go to the emergency department. In the event of inclement weather, please call our main line at [phone] for an update on the status of any delays or closures.  Dermatology Medication Tips: Please keep the boxes that topical medications come in in order to help keep track of the instructions about where and how to use these. Pharmacies typically print the medication instructions only on the boxes and not directly on the medication tubes.   If your medication is too expensive, please contact our office at [phone] or send us  a message through MyChart.    We are unable to tell what your co-pay for medications will be in advance as this is different depending on your insurance coverage. However, we may be able to find a substitute medication at lower cost or fill out paperwork to get insurance to cover a needed medication.    If a prior authorization is required to get your medication covered by your insurance company, please allow us  1-2 business days to complete this process.   Drug prices often vary depending on where the prescription is filled and some pharmacies may offer cheaper prices.   The website www.goodrx.com contains coupons for medications through different pharmacies. The prices here do not account for what the cost may be with help from insurance (it may be cheaper with your insurance), but the website can give you the price if you did not use any insurance.  - You can print the associated coupon and take it with your prescription to the pharmacy.  - You may also stop by our office during regular business hours and pick up a GoodRx coupon card.  - If you need your prescription sent electronically to a different pharmacy, notify our office through [facility] or by phone at [phone]

## 2023-12-06 LAB — SURGICAL PATHOLOGY

## 2023-12-09 ENCOUNTER — Ambulatory Visit: Payer: Self-pay | Admitting: Dermatology

## 2023-12-09 NOTE — Progress Notes (Signed)
 HI Holly Rivas,  Please call pt and notify that their bx results showed an abnormal mole that requires a full excision in office with Dr Corey  1. Skin, left upper back - superior :  --> Will conitor w/ skin checks      DYSPLASTIC JUNCTIONAL NEVUS WITH MODERATE ATYPIA, CLOSE TO MARGIN        2. Skin, right upper thigh - posterior : --> SE w/ Dr Dann      JUNCTIONAL DYSPLASTIC MELANOCYTIC NEVUS WITH MODERATE TO SEVERE ATYPIA, CLOSE TO

## 2024-01-15 ENCOUNTER — Ambulatory Visit: Admitting: Dermatology

## 2024-01-15 ENCOUNTER — Other Ambulatory Visit (HOSPITAL_BASED_OUTPATIENT_CLINIC_OR_DEPARTMENT_OTHER): Payer: Self-pay

## 2024-01-15 ENCOUNTER — Encounter: Payer: Self-pay | Admitting: Dermatology

## 2024-01-15 VITALS — BP 131/85 | HR 56 | Temp 98.3°F

## 2024-01-15 DIAGNOSIS — D225 Melanocytic nevi of trunk: Secondary | ICD-10-CM

## 2024-01-15 DIAGNOSIS — D239 Other benign neoplasm of skin, unspecified: Secondary | ICD-10-CM

## 2024-01-15 MED ORDER — IBUPROFEN 800 MG PO TABS
800.0000 mg | ORAL_TABLET | Freq: Three times a day (TID) | ORAL | 0 refills | Status: AC | PRN
  Filled 2024-01-15: qty 30, 10d supply, fill #0

## 2024-01-15 NOTE — Progress Notes (Signed)
 Follow-Up Visit   Subjective  Holly Rivas is a 38 y.o. female who presents for the following: Excision of Dysplastic nevus with Severe Atypia of the left upper back, referred by Dr. Alm.   The following portions of the chart were reviewed this encounter and updated as appropriate: medications, allergies, medical history  Review of Systems:  No other skin or systemic complaints except as noted in HPI or Assessment and Plan.  Objective  Well appearing patient in no apparent distress; mood and affect are within normal limits.  A focused examination was performed of the following areas: Back Relevant physical exam findings are noted in the Assessment and Plan.   Left Upper Back Biopsy scar   Assessment & Plan   DYSPLASTIC NEVUS Left Upper Back Skin excision - Left Upper Back  Excision method:  elliptical Lesion length (cm):  2.3 Lesion width (cm):  1.3 Margin per side (cm):  0.5 Total excision diameter (cm):  3.3 Informed consent: discussed and consent obtained   Timeout: patient name, date of birth, surgical site, and procedure verified   Procedure prep:  Patient was prepped and draped in usual sterile fashion Prep type:  Chlorhexidine Anesthesia: the lesion was anesthetized in a standard fashion   Anesthetic:  1% lidocaine  w/ epinephrine 1-100,000 buffered w/ 8.4% NaHCO3 Instrument used: #15 blade   Hemostasis achieved with: suture, pressure and electrodesiccation   Outcome: patient tolerated procedure well with no complications   Post-procedure details: sterile dressing applied and wound care instructions given   Dressing type: bandage and pressure dressing   Additional details:  Final measurements 8 cm   Skin repair - Left Upper Back Complexity:  Complex Final length (cm):  8 Informed consent: discussed and consent obtained   Timeout: patient name, date of birth, surgical site, and procedure verified   Procedure prep:  Patient was prepped and draped in usual  sterile fashion Prep type:  Chlorhexidine Anesthesia: the lesion was anesthetized in a standard fashion   Anesthetic:  1% lidocaine  w/ epinephrine 1-100,000 buffered w/ 8.4% NaHCO3 Reason for type of repair: reduce tension to allow closure, preserve normal anatomy and preserve normal anatomical and functional relationships   Undermining: area extensively undermined   Subcutaneous layers (deep stitches):  Suture size:  3-0 Suture type: PDS (polydioxanone)   Stitches:  Buried vertical mattress Fine/surface layer approximation (top stitches):  Suture type: cyanoacrylate tissue glue   Hemostasis achieved with: suture, pressure and electrodesiccation Outcome: patient tolerated procedure well with no complications   Post-procedure details: sterile dressing applied and wound care instructions given   Dressing type: bandage and pressure dressing    Related Medications ibuprofen  (ADVIL ) 800 MG tablet Take 1 tablet (800 mg total) by mouth every 8 (eight) hours as needed.  The surgical wound was then cleaned, prepped, and re-anesthetized as above. Wound edges were undermined extensively along at least one entire edge and at a distance equal to or greater than the width of the defect (see wound defect size above) in order to achieve closure and decrease wound tension and anatomic distortion. Redundant tissue repair including standing cone removal was performed. Hemostasis was achieved with electrocautery. Subcutaneous and epidermal tissues were approximated with the above sutures. The surgical site was then lightly scrubbed with sterile, saline-soaked gauze. Steri-strips were applied, and the area was then bandaged using Vaseline ointment, non-adherent gauze, gauze pads, and tape to provide an adequate pressure dressing. The patient tolerated the procedure well, was given detailed written and verbal wound  care instructions, and was discharged in good condition.   The patient will follow-up: 4  weeks.  Return if symptoms worsen or fail to improve.  I, Doyce Pan, CMA, am acting as scribe for RUFUS CHRISTELLA HOLY, MD.   Documentation: I have reviewed the above documentation for accuracy and completeness, and I agree with the above.  RUFUS CHRISTELLA HOLY, MD

## 2024-01-15 NOTE — Patient Instructions (Signed)

## 2024-01-16 LAB — SURGICAL PATHOLOGY

## 2024-01-21 ENCOUNTER — Ambulatory Visit: Payer: Self-pay | Admitting: Dermatology

## 2024-01-28 ENCOUNTER — Encounter: Payer: Self-pay | Admitting: Medical-Surgical

## 2024-01-29 ENCOUNTER — Other Ambulatory Visit (HOSPITAL_BASED_OUTPATIENT_CLINIC_OR_DEPARTMENT_OTHER): Payer: Self-pay

## 2024-01-29 MED FILL — Ondansetron Orally Disintegrating Tab 4 MG: 4.0000 mg | ORAL | 7 days supply | Qty: 20 | Fill #0 | Status: AC

## 2024-02-03 ENCOUNTER — Encounter: Payer: Self-pay | Admitting: Dermatology

## 2024-02-03 ENCOUNTER — Ambulatory Visit (INDEPENDENT_AMBULATORY_CARE_PROVIDER_SITE_OTHER): Admitting: Dermatology

## 2024-02-03 DIAGNOSIS — D225 Melanocytic nevi of trunk: Secondary | ICD-10-CM | POA: Diagnosis not present

## 2024-02-03 DIAGNOSIS — D239 Other benign neoplasm of skin, unspecified: Secondary | ICD-10-CM

## 2024-02-03 DIAGNOSIS — L905 Scar conditions and fibrosis of skin: Secondary | ICD-10-CM

## 2024-02-03 DIAGNOSIS — Z86018 Personal history of other benign neoplasm: Secondary | ICD-10-CM

## 2024-02-03 NOTE — Progress Notes (Signed)
 "  Follow-Up Visit   Subjective  Holly Rivas is a 38 y.o. female who presents for the following: Excision of a DN severe on the right abdomen upper, referred by Dr. Alm.  She is s/p WLE for a DN severe on her left upper back, treated on 01/15/2024, repaired with linear closure.   The following portions of the chart were reviewed this encounter and updated as appropriate: medications, allergies, medical history  Review of Systems:  No other skin or systemic complaints except as noted in HPI or Assessment and Plan.  Objective  Well appearing patient in no apparent distress; mood and affect are within normal limits.  A focused examination was performed of the following areas: Right abdomen upper Back Relevant physical exam findings are noted in the Assessment and Plan.   Right Abdomen  Upper Biopsy scar   Assessment & Plan   DYSPLASTIC NEVUS Right Abdomen  Upper - Skin excision - Right Abdomen  Upper  Excision method:  elliptical Lesion length (cm):  1.6 Lesion width (cm):  1 Margin per side (cm):  0.5 Total excision diameter (cm):  2.6 Informed consent: discussed and consent obtained   Timeout: patient name, date of birth, surgical site, and procedure verified   Procedure prep:  Patient was prepped and draped in usual sterile fashion Prep type:  Chlorhexidine Anesthesia: the lesion was anesthetized in a standard fashion   Anesthetic:  1% lidocaine  w/ epinephrine 1-100,000 buffered w/ 8.4% NaHCO3 Instrument used: #15 blade   Hemostasis achieved with: suture, pressure and electrodesiccation   Outcome: patient tolerated procedure well with no complications   Post-procedure details: sterile dressing applied and wound care instructions given   Dressing type: bandage and pressure dressing   Additional details:  Final measurement 6.5 cm   - Skin repair - Right Abdomen  Upper Complexity:  Complex Final length (cm):  6.5 Informed consent: discussed and consent obtained    Timeout: patient name, date of birth, surgical site, and procedure verified   Procedure prep:  Patient was prepped and draped in usual sterile fashion Prep type:  Chlorhexidine Anesthesia: the lesion was anesthetized in a standard fashion   Anesthetic:  1% lidocaine  w/ epinephrine 1-100,000 buffered w/ 8.4% NaHCO3 Reason for type of repair: reduce tension to allow closure, allow closure of the large defect and preserve normal anatomy   Undermining: area extensively undermined   Subcutaneous layers (deep stitches):  Suture size:  3-0 Suture type: PDS (polydioxanone)   Stitches:  Buried vertical mattress Fine/surface layer approximation (top stitches):  Suture type: cyanoacrylate tissue glue   Hemostasis achieved with: suture, pressure and electrodesiccation Outcome: patient tolerated procedure well with no complications   Post-procedure details: sterile dressing applied and wound care instructions given   Dressing type: bandage and pressure dressing    Specimen 1 - Surgical pathology Differential Diagnosis: DN [id number] Check Margins: No Existing Treatments - ibuprofen  (ADVIL ) 800 MG tablet - Take 1 tablet (800 mg total) by mouth every 8 (eight) hours as needed. SCAR    The surgical wound was then cleaned, prepped, and re-anesthetized as above. Wound edges were undermined extensively along at least one entire edge and at a distance equal to or greater than the width of the defect (see wound defect size above) in order to achieve closure and decrease wound tension and anatomic distortion. Redundant tissue repair including standing cone removal was performed. Hemostasis was achieved with electrocautery. Subcutaneous and epidermal tissues were approximated with the above sutures. The surgical  site was then lightly scrubbed with sterile, saline-soaked gauze. Steri-strips were applied, and the area was then bandaged using Vaseline ointment, non-adherent gauze, gauze pads, and tape to  provide an adequate pressure dressing. The patient tolerated the procedure well, was given detailed written and verbal wound care instructions, and was discharged in good condition.   The patient will follow-up: PRN.  Scar s/p WLE for DN severe on the left upper back, treated on 01/15/2024, repaired with linear closure - Reassured that wound has healed well - Discussed that scars take up to 12 months to mature from the date of surgery - Recommend SPF 30+ to scar daily to prevent purple color - OK to start scar massage at 4-6 weeks post-op - Can consider silicone based products for scar healing  History of Dysplastic Nevi - No evidence of recurrence today - Recommend regular full body skin exams - Recommend daily broad spectrum sunscreen SPF 30+ to sun-exposed areas, reapply every 2 hours as needed.  - Call if any new or changing lesions are noted between office visits   Return if symptoms worsen or fail to improve.  I, Doyce Pan, CMA, am acting as scribe for RUFUS CHRISTELLA HOLY, MD.   Documentation: I have reviewed the above documentation for accuracy and completeness, and I agree with the above.  RUFUS CHRISTELLA HOLY, MD  "

## 2024-02-03 NOTE — Patient Instructions (Signed)

## 2024-02-05 LAB — SURGICAL PATHOLOGY

## 2024-02-08 ENCOUNTER — Ambulatory Visit: Payer: Self-pay | Admitting: Dermatology

## 2024-02-12 ENCOUNTER — Encounter: Payer: Self-pay | Admitting: Medical-Surgical

## 2024-02-14 ENCOUNTER — Other Ambulatory Visit (HOSPITAL_BASED_OUTPATIENT_CLINIC_OR_DEPARTMENT_OTHER): Payer: Self-pay

## 2024-02-14 MED ORDER — OFLOXACIN 0.3 % OP SOLN
OPHTHALMIC | 0 refills | Status: AC
  Filled 2024-02-14: qty 5, 5d supply, fill #0

## 2024-02-21 ENCOUNTER — Encounter: Payer: Self-pay | Admitting: Medical-Surgical

## 2024-02-24 ENCOUNTER — Encounter: Admitting: Dermatology

## 2024-03-19 ENCOUNTER — Encounter: Payer: Self-pay | Admitting: Dermatology

## 2024-03-19 ENCOUNTER — Ambulatory Visit: Admitting: Dermatology

## 2024-03-19 DIAGNOSIS — L910 Hypertrophic scar: Secondary | ICD-10-CM

## 2024-03-19 DIAGNOSIS — Z86018 Personal history of other benign neoplasm: Secondary | ICD-10-CM

## 2024-03-19 DIAGNOSIS — Z09 Encounter for follow-up examination after completed treatment for conditions other than malignant neoplasm: Secondary | ICD-10-CM

## 2024-03-19 DIAGNOSIS — D239 Other benign neoplasm of skin, unspecified: Secondary | ICD-10-CM

## 2024-03-19 DIAGNOSIS — L905 Scar conditions and fibrosis of skin: Secondary | ICD-10-CM

## 2024-03-19 MED ORDER — TRIAMCINOLONE ACETONIDE 10 MG/ML IJ SUSP
10.0000 mg | Freq: Once | INTRAMUSCULAR | Status: AC
  Administered 2024-03-19: 10 mg

## 2024-03-19 NOTE — Progress Notes (Signed)
" ° °  Follow Up Visit   Subjective  Holly Rivas is a 39 y.o. female who presents for the following: follow up from Mohs surgery   The patient presents for follow up from Excision of a DN on the left upper back, treated on 01/15/2024, repaired with linear closure. The patient has been bandaging the wound as directed. The endorse the following concerns: bump at the end of the suture line that is sore.   The following portions of the chart were reviewed this encounter and updated as appropriate: medications, allergies, medical history  Review of Systems:  No other skin or systemic complaints except as noted in HPI or Assessment and Plan.  Objective  Well appearing patient in no apparent distress; mood and affect are within normal limits.  A focal examination was performed including on the back. All findings within normal limits unless otherwise noted below.  Healing wound with mild erythema  Relevant physical exam findings are noted in the Assessment and Plan.   Left Upper Back Pink tender plaque  Assessment & Plan    Scar s/p WLE for DN severe on the left upper back with spitting suture, treated on 01/15/2024, repaired with linear closure - Reassured that wound has healed well - Discussed that scars take up to 12 months to mature from the date of surgery - Recommend SPF 30+ to scar daily to prevent purple color - OK to start scar massage at 4-6 weeks post-op - Can consider silicone based products for scar healing - Possible suture reaction present. Suture material removed and kenalog  injection for inflammation.  History of Dysplastic Nevi - No evidence of recurrence today - Recommend regular full body skin exams - Recommend daily broad spectrum sunscreen SPF 30+ to sun-exposed areas, reapply every 2 hours as needed.  - Call if any new or changing lesions are noted between office visits KELOID Left Upper Back - Intralesional injection - Left Upper Back Procedure Note  Intralesional Injection  Location: left upper back  Informed Consent: Discussed risks (infection, pain, bleeding, bruising, thinning of the skin, loss of skin pigment, lack of resolution, and recurrence of lesion) and benefits of the procedure, as well as the alternatives. Informed consent was obtained. Preparation: The area was prepared a standard fashion.  Anesthesia:Lidocaine  1% with epinephrine  Procedure Details: An intralesional injection was performed with Kenalog  10 mg/cc. 0.5 cc in total were injected. NDC #: 9996-9505-79 Exp: 04/2024  Total number of injections: 1  Plan: The patient was instructed on post-op care. Recommend OTC analgesia as needed for pain.   This Visit - triamcinolone  acetonide (KENALOG ) 10 MG/ML injection 10 mg SCAR   DYSPLASTIC NEVUS   Existing Treatments - ibuprofen  (ADVIL ) 800 MG tablet - Take 1 tablet (800 mg total) by mouth every 8 (eight) hours as needed.  Return if symptoms worsen or fail to improve.  LILLETTE Rollene Gobble, RN, am acting as scribe for RUFUS CHRISTELLA HOLY, MD .   Documentation: I have reviewed the above documentation for accuracy and completeness, and I agree with the above.  RUFUS CHRISTELLA HOLY, MD "

## 2024-06-03 ENCOUNTER — Ambulatory Visit: Admitting: Dermatology
